# Patient Record
Sex: Female | Born: 1935 | Race: White | Hispanic: No | Smoking: Never smoker
Health system: Southern US, Community
[De-identification: ages and names within clinical notes are randomized; demographics above are authoritative.]

## PROBLEM LIST (undated history)

## (undated) DIAGNOSIS — I10 Essential (primary) hypertension: Secondary | ICD-10-CM

## (undated) DIAGNOSIS — R296 Repeated falls: Secondary | ICD-10-CM

## (undated) DIAGNOSIS — F0390 Unspecified dementia without behavioral disturbance: Secondary | ICD-10-CM

## (undated) HISTORY — PX: KIDNEY SURGERY: SHX687

## (undated) HISTORY — DX: Repeated falls: R29.6

## (undated) HISTORY — PX: FOOT SURGERY: SHX648

---

## 2013-07-31 ENCOUNTER — Emergency Department (HOSPITAL_COMMUNITY): Payer: Medicare Other

## 2013-07-31 ENCOUNTER — Encounter (HOSPITAL_COMMUNITY): Payer: Self-pay | Admitting: Emergency Medicine

## 2013-07-31 ENCOUNTER — Emergency Department (HOSPITAL_COMMUNITY)
Admission: EM | Admit: 2013-07-31 | Discharge: 2013-07-31 | Disposition: A | Discharge status: Home / Self Care | Payer: Medicare Other | Attending: Emergency Medicine | Admitting: Emergency Medicine

## 2013-07-31 DIAGNOSIS — N390 Urinary tract infection, site not specified: Secondary | ICD-10-CM | POA: Insufficient documentation

## 2013-07-31 DIAGNOSIS — Y929 Unspecified place or not applicable: Secondary | ICD-10-CM | POA: Insufficient documentation

## 2013-07-31 DIAGNOSIS — Z043 Encounter for examination and observation following other accident: Secondary | ICD-10-CM | POA: Insufficient documentation

## 2013-07-31 DIAGNOSIS — R413 Other amnesia: Secondary | ICD-10-CM | POA: Insufficient documentation

## 2013-07-31 DIAGNOSIS — R296 Repeated falls: Secondary | ICD-10-CM | POA: Insufficient documentation

## 2013-07-31 DIAGNOSIS — I10 Essential (primary) hypertension: Secondary | ICD-10-CM | POA: Insufficient documentation

## 2013-07-31 DIAGNOSIS — Y939 Activity, unspecified: Secondary | ICD-10-CM | POA: Insufficient documentation

## 2013-07-31 DIAGNOSIS — W19XXXA Unspecified fall, initial encounter: Secondary | ICD-10-CM

## 2013-07-31 LAB — URINE MICROSCOPIC-ADD ON

## 2013-07-31 LAB — CBC
HCT: 38 % (ref 36.0–46.0)
MCH: 29.9 pg (ref 26.0–34.0)
MCV: 90.9 fL (ref 78.0–100.0)
Platelets: 281 10*3/uL (ref 150–400)
RDW: 13.2 % (ref 11.5–15.5)

## 2013-07-31 LAB — URINALYSIS, ROUTINE W REFLEX MICROSCOPIC
Bilirubin Urine: NEGATIVE
Glucose, UA: NEGATIVE mg/dL
Ketones, ur: NEGATIVE mg/dL
Protein, ur: NEGATIVE mg/dL
pH: 7 (ref 5.0–8.0)

## 2013-07-31 LAB — BASIC METABOLIC PANEL
BUN: 24 mg/dL — ABNORMAL HIGH (ref 6–23)
CO2: 27 mEq/L (ref 19–32)
Calcium: 9.3 mg/dL (ref 8.4–10.5)
Chloride: 102 mEq/L (ref 96–112)
Creatinine, Ser: 0.68 mg/dL (ref 0.50–1.10)

## 2013-07-31 LAB — POCT I-STAT TROPONIN I: Troponin i, poc: 0.04 ng/mL (ref 0.00–0.08)

## 2013-07-31 MED ORDER — CEFPODOXIME PROXETIL 100 MG PO TABS: 100.0000 mg | ORAL_TABLET | Freq: Two times a day (BID) | ORAL | Status: DC

## 2013-07-31 MED ORDER — DEXTROSE 5 % IV SOLN
1.0000 g | Freq: Once | INTRAVENOUS | Status: AC
  Administered 2013-07-31: 1 g via INTRAVENOUS
  Filled 2013-07-31: qty 10

## 2013-07-31 NOTE — ED Provider Notes (Signed)
CSN: [id number]     Arrival date & time 07/31/13  1000 History   First MD Initiated Contact with Patient 07/31/13 [phone]     Chief Complaint  Patient presents with  . Fall   (Consider location/radiation/quality/duration/timing/severity/associated sxs/prior Treatment) HPI Comments: Patient fell while making the bed. She turned around and then realized she was falling. Denies any CP, SOB, dizziness. No syncope or near-syncope. Hypertensive with EMS at 200/100. Patient states she hasn't seen a doctor in a long time (>25 years). Not on any medications. She lives independently in an assisted living facility. EMS states some memory problems.  Patient is a 79 y.o. female presenting with fall. The history is provided by the patient and the EMS personnel.  Fall This is a new problem. The current episode started less than 1 hour ago. Episode frequency: once. The problem has been resolved. Pertinent negatives include no chest pain, no abdominal pain, no headaches and no shortness of breath. Nothing aggravates the symptoms. Nothing relieves the symptoms. She has tried nothing for the symptoms. The treatment provided no relief.    History reviewed. No pertinent past medical history. Past Surgical History  Procedure Laterality Date  . Foot surgery    . Kidney surgery     History reviewed. No pertinent family history. History  Substance Use Topics  . Smoking status: Never Smoker   . Smokeless tobacco: Never Used  . Alcohol Use: No   OB History   Grav Para Term Preterm Abortions TAB SAB Ect Mult Living                 Review of Systems  Constitutional: Negative for fever.  Respiratory: Negative for shortness of breath.   Cardiovascular: Negative for chest pain.  Gastrointestinal: Negative for abdominal pain.  Neurological: Negative for headaches.  All other systems reviewed and are negative.    Allergies  Review of patient's allergies indicates no known allergies.  Home Medications    Current Outpatient Rx  Name  Route  Sig  Dispense  Refill  . cefpodoxime (VANTIN) 100 MG tablet   Oral   Take 1 tablet (100 mg total) by mouth 2 (two) times daily.   28 tablet   0    BP 165/59  Pulse 68  Temp(Src) 97 F (36.1 C)  Resp 15  Ht 4\' 4"  (1.321 m)  Wt 95 lb (43.092 kg)  BMI 24.69 kg/m2  SpO2 98% Physical Exam  Nursing note and vitals reviewed. Constitutional: She is oriented to person, place, and time. She appears well-developed and well-nourished. No distress.  HENT:  Head: Normocephalic and atraumatic.  Eyes: EOM are normal. Pupils are equal, round, and reactive to light.  Neck: Normal range of motion. Neck supple.  Cardiovascular: Normal rate and regular rhythm.  Exam reveals no friction rub.   No murmur heard. Pulmonary/Chest: Effort normal and breath sounds normal. No respiratory distress. She has no wheezes. She has no rales.  Abdominal: Soft. She exhibits no distension. There is no tenderness. There is no rebound.  Musculoskeletal: Normal range of motion. She exhibits edema (mild, 1+ bilaterally - chronic).  Neurological: She is alert and oriented to person, place, and time. No cranial nerve deficit. She exhibits normal muscle tone. Coordination normal.  Skin: She is not diaphoretic.    ED Course  Procedures (including critical care time) Labs Review Labs Reviewed  BASIC METABOLIC PANEL - Abnormal; Notable for the following:    Glucose, Bld 106 (*)    BUN 24 (*)  GFR calc non Af Amer 71 (*)    GFR calc Af Amer 82 (*)    All other components within normal limits  URINALYSIS, ROUTINE W REFLEX MICROSCOPIC - Abnormal; Notable for the following:    APPearance CLOUDY (*)    Hgb urine dipstick SMALL (*)    Leukocytes, UA LARGE (*)    All other components within normal limits  URINE MICROSCOPIC-ADD ON - Abnormal; Notable for the following:    Squamous Epithelial / LPF FEW (*)    Bacteria, UA FEW (*)    All other components within normal limits   URINE CULTURE  CBC  POCT I-STAT TROPONIN I  POCT I-STAT TROPONIN I   Imaging Review Ct Head Wo Contrast  07/31/2013   *RADIOLOGY REPORT*  Clinical Data: Traumatic injury with pain  CT HEAD WITHOUT CONTRAST  Technique:  Contiguous axial images were obtained from the base of the skull through the vertex without contrast.  Comparison: None.  Findings: The bony calvarium is intact.  Diffuse atrophic changes in chronic white matter ischemic change is seen.  No findings to suggest acute hemorrhage, acute infarct or space-occupying mass lesion are noted.  IMPRESSION: Chronic changes without acute abnormality.   Original Report Authenticated By: Alcide Clever, M.D.     Date: 07/31/2013  Rate: 71  Rhythm: normal sinus rhythm  QRS Axis: left  Intervals: QT 463, QTc 504  ST/T Wave abnormalities: normal  Conduction Disutrbances:LAFB  Narrative Interpretation: Concern for LVH  Old EKG Reviewed: none available   MDM   1. Fall, initial encounter   2. Hypertension   3. UTI (urinary tract infection)     79 year old female presents with fall at home. Likely mechanical, she denies any preceding symptoms of chest pain, shortness of breath. No syncope. Denies any injury or pain at this time. Is not currently on any medications. Her initial blood pressure was 200/100 with EMS. States she feels well has not had any current issues. On exam, she is neurologically intact, moving all extremities, cranial nerves intact. No signs of trauma. With the fall and elevated blood pressures, will check basic labs and head CT. Labs show UTI - Rocephin given, will give Rx for Vantin. [name], can attend to patient and help with med administration. BP improving, last check 160s systolic. Instructed PCP f/u. Stable for discharge.   Dagmar Hait, MD 07/31/13 [phone]

## 2013-07-31 NOTE — ED Notes (Signed)
Pt from independent living, c/o fall. Pt denies pain, Per EMS pt was HTN initial 200/90.

## 2013-08-01 LAB — URINE CULTURE

## 2015-05-05 ENCOUNTER — Emergency Department (HOSPITAL_COMMUNITY)
Admission: EM | Admit: 2015-05-05 | Discharge: 2015-05-05 | Disposition: A | Discharge status: Home / Self Care | Payer: Medicare Other | Attending: Emergency Medicine | Admitting: Emergency Medicine

## 2015-05-05 ENCOUNTER — Emergency Department (HOSPITAL_COMMUNITY): Payer: Medicare Other

## 2015-05-05 ENCOUNTER — Encounter (HOSPITAL_COMMUNITY): Payer: Self-pay

## 2015-05-05 DIAGNOSIS — Y998 Other external cause status: Secondary | ICD-10-CM | POA: Diagnosis not present

## 2015-05-05 DIAGNOSIS — D649 Anemia, unspecified: Secondary | ICD-10-CM | POA: Diagnosis not present

## 2015-05-05 DIAGNOSIS — Z79899 Other long term (current) drug therapy: Secondary | ICD-10-CM | POA: Insufficient documentation

## 2015-05-05 DIAGNOSIS — S8002XA Contusion of left knee, initial encounter: Secondary | ICD-10-CM | POA: Diagnosis not present

## 2015-05-05 DIAGNOSIS — W1839XA Other fall on same level, initial encounter: Secondary | ICD-10-CM | POA: Insufficient documentation

## 2015-05-05 DIAGNOSIS — I10 Essential (primary) hypertension: Secondary | ICD-10-CM | POA: Diagnosis not present

## 2015-05-05 DIAGNOSIS — F0390 Unspecified dementia without behavioral disturbance: Secondary | ICD-10-CM | POA: Insufficient documentation

## 2015-05-05 DIAGNOSIS — Y9389 Activity, other specified: Secondary | ICD-10-CM | POA: Diagnosis not present

## 2015-05-05 DIAGNOSIS — W19XXXA Unspecified fall, initial encounter: Secondary | ICD-10-CM

## 2015-05-05 DIAGNOSIS — Y9289 Other specified places as the place of occurrence of the external cause: Secondary | ICD-10-CM | POA: Insufficient documentation

## 2015-05-05 DIAGNOSIS — S8012XA Contusion of left lower leg, initial encounter: Secondary | ICD-10-CM

## 2015-05-05 DIAGNOSIS — S8992XA Unspecified injury of left lower leg, initial encounter: Secondary | ICD-10-CM | POA: Diagnosis present

## 2015-05-05 HISTORY — DX: Unspecified dementia, unspecified severity, without behavioral disturbance, psychotic disturbance, mood disturbance, and anxiety: F03.90

## 2015-05-05 HISTORY — DX: Essential (primary) hypertension: I10

## 2015-05-05 LAB — CBC WITH DIFFERENTIAL/PLATELET
BASOS ABS: 0 10*3/uL (ref 0.0–0.1)
Basophils Relative: 0 % (ref 0–1)
Eosinophils Absolute: 0.1 10*3/uL (ref 0.0–0.7)
Eosinophils Relative: 1 % (ref 0–5)
HEMATOCRIT: 33.9 % — AB (ref 36.0–46.0)
HEMOGLOBIN: 10.8 g/dL — AB (ref 12.0–15.0)
LYMPHS ABS: 1.6 10*3/uL (ref 0.7–4.0)
Lymphocytes Relative: 18 % (ref 12–46)
MCH: 27 pg (ref 26.0–34.0)
MCHC: 31.9 g/dL (ref 30.0–36.0)
MCV: 84.8 fL (ref 78.0–100.0)
MONO ABS: 0.9 10*3/uL (ref 0.1–1.0)
Monocytes Relative: 10 % (ref 3–12)
NEUTROS ABS: 6.5 10*3/uL (ref 1.7–7.7)
NEUTROS PCT: 71 % (ref 43–77)
Platelets: 262 10*3/uL (ref 150–400)
RBC: 4 MIL/uL (ref 3.87–5.11)
RDW: 15 % (ref 11.5–15.5)
WBC: 9.1 10*3/uL (ref 4.0–10.5)

## 2015-05-05 LAB — BASIC METABOLIC PANEL
ANION GAP: 9 (ref 5–15)
BUN: 15 mg/dL (ref 6–20)
CALCIUM: 8.7 mg/dL — AB (ref 8.9–10.3)
CHLORIDE: 104 mmol/L (ref 101–111)
CO2: 24 mmol/L (ref 22–32)
Creatinine, Ser: 0.58 mg/dL (ref 0.44–1.00)
GFR calc Af Amer: >60 mL/min (ref >60)
GFR calc non Af Amer: >60 mL/min (ref >60)
GLUCOSE: 103 mg/dL — AB (ref 65–99)
Potassium: 3.9 mmol/L (ref 3.5–5.1)
Sodium: 137 mmol/L (ref 135–145)

## 2015-05-05 NOTE — ED Notes (Signed)
[facility]. Pt c/o pain in the left knee with swelling noted. Pt has hx of dementia.

## 2015-05-05 NOTE — ED Provider Notes (Signed)
CSN: [id number]     Arrival date & time 05/05/15  0815 History   First MD Initiated Contact with Patient 05/05/15 [phone]     Chief Complaint  Patient presents with  . Fall  . Knee Pain     (Consider location/radiation/quality/duration/timing/severity/associated sxs/prior Treatment) HPI  [age] year old female persist with a possible fall and swelling to her leg. Patient is demented and while she is not working confused than normal she is unable to provide a history. She does state that she fell but cannot tell how. Daughter at the bedside states that she usually walks with a rolling walker but cannot walk on her own. Does have a history of falls. The nursing home states that they found her standing next to her bed and they saw her leg was swollen and found an abrasion on her back. They assume she fell but no one saw her fall. Is not on blood thinners.  Past Medical History  Diagnosis Date  . Dementia   . Hypertension    Past Surgical History  Procedure Laterality Date  . Foot surgery    . Kidney surgery     History reviewed. No pertinent family history. History  Substance Use Topics  . Smoking status: Never Smoker   . Smokeless tobacco: Never Used  . Alcohol Use: No   OB History    No data available     Review of Systems  Unable to perform ROS: Dementia      Allergies  Review of patient's allergies indicates no known allergies.  Home Medications   Prior to Admission medications   Medication Sig Start Date End Date Taking? Authorizing Provider  acetaminophen (TYLENOL) 500 MG tablet Take 500 mg by mouth 2 (two) times daily.   Yes Historical Provider, MD  ergocalciferol (VITAMIN D2) 50000 UNITS capsule Take 50,000 Units by mouth once a week.   Yes Historical Provider, MD  torsemide (DEMADEX) 20 MG tablet Take 20 mg by mouth daily.   Yes Historical Provider, MD   BP 160/52 mmHg  Pulse 76  Temp(Src) 97.9 F (36.6 C) (Oral)  Resp 18  SpO2 97% Physical Exam   Constitutional: She appears well-developed and well-nourished.  HENT:  Head: Normocephalic and atraumatic.  Right Ear: External ear normal.  Left Ear: External ear normal.  Nose: Nose normal.  Eyes: Right eye exhibits no discharge. Left eye exhibits no discharge.  Cardiovascular: Normal rate, regular rhythm and normal heart sounds.   Pulmonary/Chest: Effort normal and breath sounds normal.  Abdominal: Soft. She exhibits no distension. There is no tenderness.  Musculoskeletal: She exhibits edema (bilateral lower extremity edema).       Left hip: She exhibits no tenderness.       Left knee: She exhibits normal range of motion. No tenderness found.       Left ankle: She exhibits normal range of motion. No tenderness.       Left lower leg: She exhibits tenderness.       Left foot: There is normal range of motion and no tenderness.  Has pain with ROM of left knee/hip but does not localize which one hurts  Neurological: She is alert. She is disoriented.  Skin: Skin is warm and dry.  Nursing note and vitals reviewed.   ED Course  Procedures (including critical care time) Labs Review Labs Reviewed  BASIC METABOLIC PANEL - Abnormal; Notable for the following:    Glucose, Bld 103 (*)    Calcium 8.7 (*)    All  other components within normal limits  CBC WITH DIFFERENTIAL/PLATELET - Abnormal; Notable for the following:    Hemoglobin 10.8 (*)    HCT 33.9 (*)    All other components within normal limits    Imaging Review Dg Tibia/fibula Left  05/05/2015   CLINICAL DATA:  Fall.  LEFT leg pain.  Initial encounter.  EXAM: LEFT TIBIA AND FIBULA - 2 VIEW  COMPARISON:  Knee radiographs today.  FINDINGS: The tibia and fibula are within normal limits. Negative for fracture. Atherosclerosis. Subcutaneous edema is present, more pronounced in the distal leg. A few scattered phleboliths are present.  IMPRESSION: No acute osseous abnormality.   Electronically Signed   By: Andreas Newport M.D.   On:  05/05/2015 09:43   Dg Knee Complete 4 Views Left  05/05/2015   CLINICAL DATA:  Fall.  Knee pain.  Initial encounter.  EXAM: LEFT KNEE - COMPLETE 4+ VIEW  COMPARISON:  None.  FINDINGS: Osteopenia. Atherosclerosis. Tricompartmental osteoarthritis of the knee. No displaced fracture. Severe patellofemoral osteoarthritis. No knee effusion. Cortical density is present along the anterior aspect of the distal femoral diaphysis, likely associated with chronic degenerative remodeling.  IMPRESSION: Osteopenia. Tricompartmental osteoarthritis of the knee. No acute abnormality.   Electronically Signed   By: Andreas Newport M.D.   On: 05/05/2015 09:42   Dg Hip Unilat With Pelvis 2-3 Views Left  05/05/2015   CLINICAL DATA:  Fall.  Initial encounter.  LEFT hip pain.  EXAM: LEFT HIP (WITH PELVIS) 2-3 VIEWS  COMPARISON:  None.  FINDINGS: Osteopenia. The pelvic rings appear intact. The RIGHT hip is externally rotated. There is no displaced hip fracture. The obturator rings appear intact. Sacrum is obscured by stool. Stool and bowel gas projects over the LEFT inferior pubic ramus. This may be due to pelvic floor laxity or colon with an an inguinal hernia or femoral hernia. Atherosclerosis.  If there is high clinical suspicion for occult hip fracture or the patient refuses to weightbear, consider further evaluation with MRI. Although CT is expeditious, evidence is lacking regarding accuracy of CT over plain film radiography.  IMPRESSION: Osteopenia.  No displaced fracture.   Electronically Signed   By: Andreas Newport M.D.   On: 05/05/2015 09:46     EKG Interpretation None      MDM   Final diagnoses:  Fall, initial encounter  Contusion of left leg, initial encounter     family notes the patient has a history of frequent falls and is very unstable on her feet. Given that the fall was not witnessed lab work was obtained but is unremarkable besides anemia that is not significant different from previous.  Patient's x-rays  are unremarkable. She is able to stand up and bear weight and thus I have low suspicion for an occult fracture. Stable for discharge back to her facility.    Pricilla Loveless, MD 05/05/15 [phone]

## 2015-05-05 NOTE — Discharge Instructions (Signed)
°Contusion °A contusion is a deep bruise. Contusions are the result of an injury that caused bleeding under the skin. The contusion may turn blue, purple, or yellow. Minor injuries will give you a painless contusion, but more severe contusions may stay painful and swollen for a few weeks.  °CAUSES  °A contusion is usually caused by a blow, trauma, or direct force to an area of the body. °SYMPTOMS  °· Swelling and redness of the injured area. °· Bruising of the injured area. °· Tenderness and soreness of the injured area. °· Pain. °DIAGNOSIS  °The diagnosis can be made by taking a history and physical exam. An X-ray, CT scan, or MRI may be needed to determine if there were any associated injuries, such as fractures. °TREATMENT  °Specific treatment will depend on what area of the body was injured. In general, the best treatment for a contusion is resting, icing, elevating, and applying cold compresses to the injured area. Over-the-counter medicines may also be recommended for pain control. Ask your caregiver what the best treatment is for your contusion. °HOME CARE INSTRUCTIONS  °· Put ice on the injured area. °· Put ice in a plastic bag. °· Place a towel between your skin and the bag. °· Leave the ice on for 15-20 minutes, 3-4 times a day, or as directed by your health care provider. °· Only take over-the-counter or prescription medicines for pain, discomfort, or fever as directed by your caregiver. Your caregiver may recommend avoiding anti-inflammatory medicines (aspirin, ibuprofen, and naproxen) for 48 hours because these medicines may increase bruising. °· Rest the injured area. °· If possible, elevate the injured area to reduce swelling. °SEEK IMMEDIATE MEDICAL CARE IF:  °· You have increased bruising or swelling. °· You have pain that is getting worse. °· Your swelling or pain is not relieved with medicines. °MAKE SURE YOU:  °· Understand these instructions. °· Will watch your condition. °· Will get help  right away if you are not doing well or get worse. °Document Released: 08/23/2005 Document Revised: 11/18/2013 Document Reviewed: 09/18/2011 °Exit[employer]® Patient Information ©2015 [employer]. This information is not intended to replace advice given to you by your health care provider. Make sure you discuss any questions you have with your health care provider. °Fall Prevention and Home Safety °Falls cause injuries and can affect all age groups. It is possible to use preventive measures to significantly decrease the likelihood of falls. There are many simple measures which can make your home safer and prevent falls. °OUTDOORS °· Repair cracks and edges of walkways and driveways. °· Remove high doorway thresholds. °· Trim shrubbery on the main path into your home. °· Have good outside lighting. °· Clear walkways of tools, rocks, debris, and clutter. °· Check that handrails are not broken and are securely fastened. Both sides of steps should have handrails. °· Have leaves, snow, and ice cleared regularly. °· Use sand or salt on walkways during winter months. °· In the garage, clean up grease or oil spills. °BATHROOM °· Install night lights. °· Install grab bars by the toilet and in the tub and shower. °· Use non-skid mats or decals in the tub or shower. °· Place a plastic non-slip stool in the shower to sit on, if needed. °· Keep floors dry and clean up all water on the floor immediately. °· Remove soap buildup in the tub or shower on a regular basis. °· Secure bath mats with non-slip, double-sided rug tape. °· Remove throw rugs and tripping hazards   from the floors. °BEDROOMS °· Install night lights. °· Make sure a bedside light is easy to reach. °· Do not use oversized bedding. °· Keep a telephone by your bedside. °· Have a firm chair with side arms to use for getting dressed. °· Remove throw rugs and tripping hazards from the floor. °KITCHEN °· Keep handles on pots and pans turned toward the center of the stove. Use  back burners when possible. °· Clean up spills quickly and allow time for drying. °· Avoid walking on wet floors. °· Avoid hot utensils and knives. °· Position shelves so they are not too high or low. °· Place commonly used objects within easy reach. °· If necessary, use a sturdy step stool with a grab bar when reaching. °· Keep electrical cables out of the way. °· Do not use floor polish or wax that makes floors slippery. If you must use wax, use non-skid floor wax. °· Remove throw rugs and tripping hazards from the floor. °STAIRWAYS °· Never leave objects on stairs. °· Place handrails on both sides of stairways and use them. Fix any loose handrails. Make sure handrails on both sides of the stairways are as long as the stairs. °· Check carpeting to make sure it is firmly attached along stairs. Make repairs to worn or loose carpet promptly. °· Avoid placing throw rugs at the top or bottom of stairways, or properly secure the rug with carpet tape to prevent slippage. Get rid of throw rugs, if possible. °· Have an electrician put in a light switch at the top and bottom of the stairs. °OTHER FALL PREVENTION TIPS °· Wear low-heel or rubber-soled shoes that are supportive and fit well. Wear closed toe shoes. °· When using a stepladder, make sure it is fully opened and both spreaders are firmly locked. Do not climb a closed stepladder. °· Add color or contrast paint or tape to grab bars and handrails in your home. Place contrasting color strips on first and last steps. °· Learn and use mobility aids as needed. Install an electrical emergency response system. °· Turn on lights to avoid dark areas. Replace light bulbs that burn out immediately. Get light switches that glow. °· Arrange furniture to create clear pathways. Keep furniture in the same place. °· Firmly attach carpet with non-skid or double-sided tape. °· Eliminate uneven floor surfaces. °· Select a carpet pattern that does not visually hide the edge of  steps. °· Be aware of all pets. °OTHER HOME SAFETY TIPS °· Set the water temperature for 120° F (48.8° C). °· Keep emergency numbers on or near the telephone. °· Keep smoke detectors on every level of the home and near sleeping areas. °Document Released: 11/03/2002 Document Revised: 05/14/2012 Document Reviewed: 02/02/2012 °ExitCare® Patient Information ©2015 [employer]. This information is not intended to replace advice given to you by your health care provider. Make sure you discuss any questions you have with your health care provider. ° °

## 2016-05-31 LAB — CBC AND DIFFERENTIAL
HEMATOCRIT: 31 % — AB (ref 36–46)
HEMOGLOBIN: 10.2 g/dL — AB (ref 12.0–16.0)
PLATELETS: 272 10*3/uL (ref 150–399)
WBC: 6.6 10*3/mL

## 2016-05-31 LAB — HEPATIC FUNCTION PANEL
ALT: 8 U/L (ref 7–35)
AST: 12 U/L — AB (ref 13–35)
Alkaline Phosphatase: 61 U/L (ref 25–125)
BILIRUBIN, TOTAL: 0.5 mg/dL

## 2016-05-31 LAB — BASIC METABOLIC PANEL
BUN: 18 mg/dL (ref 4–21)
Creatinine: 0.7 mg/dL (ref 0.5–1.1)
Glucose: 98 mg/dL
POTASSIUM: 4.5 mmol/L (ref 3.4–5.3)
Sodium: 140 mmol/L (ref 137–147)

## 2016-06-02 ENCOUNTER — Encounter: Payer: Self-pay | Admitting: Adult Health

## 2016-06-02 ENCOUNTER — Non-Acute Institutional Stay (SKILLED_NURSING_FACILITY): Payer: Medicare Other | Admitting: Adult Health

## 2016-06-02 DIAGNOSIS — F0150 Vascular dementia without behavioral disturbance: Secondary | ICD-10-CM | POA: Diagnosis not present

## 2016-06-02 DIAGNOSIS — E46 Unspecified protein-calorie malnutrition: Secondary | ICD-10-CM | POA: Diagnosis not present

## 2016-06-02 DIAGNOSIS — R600 Localized edema: Secondary | ICD-10-CM | POA: Diagnosis not present

## 2016-06-02 DIAGNOSIS — I10 Essential (primary) hypertension: Secondary | ICD-10-CM

## 2016-06-02 DIAGNOSIS — R296 Repeated falls: Secondary | ICD-10-CM | POA: Diagnosis not present

## 2016-06-02 NOTE — Progress Notes (Signed)
Patient ID: Sara Caldwell, female   DOB: 09/03/1913, [age] y.o.   MRN: [id number]   [facility] Room Number: 151-A Place of Service:  SNF (31)   CODE STATUS: DNR  No Known Allergies   Chief Complaint  Patient presents with  . Acute Visit    follow up transfer      HPI:  She has been transferred to this facility from her home. She is confused to her circumstance. She is alert to herself. She has had frequent falls. She tells me that she is feeling good. She denies any pain. There are no nursing concerns at this time. More than likely this does represent a long term placement for her.    Past Medical History  Diagnosis Date  . Dementia   . Hypertension   . Frequent falls    Past Surgical History  Procedure Laterality Date  . Foot surgery    . Kidney surgery       Social History   Social History  . Marital Status: Widowed    Spouse Name: N/A  . Number of Children: N/A  . Years of Education: N/A   Occupational History  . Not on file.   Social History Main Topics  . Smoking status: Never Smoker   . Smokeless tobacco: Never Used  . Alcohol Use: No  . Drug Use: No  . Sexual Activity: Not on file   Other Topics Concern  . Not on file   Social History Narrative   History reviewed. No pertinent family history.  Immunization History  Administered Date(s) Administered  . PPD Test 05/29/2016     VITAL SIGNS BP 122/66 mmHg  Pulse 79  Temp(Src) 97.7 F (36.5 C) (Oral)  Resp 18  Ht 5\' 3"  (1.6 m)  Wt 97 lb (43.999 kg)  BMI 17.19 kg/m2  SpO2 96%  Patient's Medications  New Prescriptions   No medications on file  Previous Medications   ACETAMINOPHEN (TYLENOL) 500 MG TABLET    Take 500 mg by mouth 2 (two) times daily.   ERGOCALCIFEROL (VITAMIN D2) 50000 UNITS CAPSULE    Take 50,000 Units by mouth once a week.   TORSEMIDE (DEMADEX) 20 MG TABLET    Take 40 mg by mouth daily.  Modified Medications   No medications on file    Discontinued Medications   TORSEMIDE (DEMADEX) 20 MG TABLET    Take 20 mg by mouth daily. Reported on 06/02/2016     SIGNIFICANT DIAGNOSTIC EXAMS   LABS REVIEWED:   05-31-16: wbc 6.6 hgb 10.2; hct 31.2 mcv 88.2; plt 272; glucose 98; bun 18; creat 0.72; k+ 4.5; na++ 140 ;liver normal albumin 3.0     Review of Systems  Constitutional: Negative for malaise/fatigue.  Respiratory: Negative for cough and shortness of breath.   Cardiovascular: Negative for chest pain and leg swelling.  Gastrointestinal: Negative for heartburn, abdominal pain and constipation.  Musculoskeletal: Negative for myalgias, back pain and joint pain.  Skin: Negative.   Psychiatric/Behavioral: The patient is not nervous/anxious.     Physical Exam  Constitutional: No distress.  Frail   Eyes: Conjunctivae are normal.  Neck: Neck supple. No JVD present. No thyromegaly present.  Cardiovascular: Normal rate, regular rhythm and intact distal pulses.   Respiratory: Effort normal and breath sounds normal. No respiratory distress. She has no wheezes.  GI: Soft. Bowel sounds are normal. She exhibits no distension. There is no tenderness.  Musculoskeletal: She exhibits edema.  Able to  move all extremities  Trace bilateral lower extremity edema   Lymphadenopathy:    She has no cervical adenopathy.  Neurological: She is alert.  Skin: Skin is warm and dry. She is not diaphoretic.  Psychiatric: She has a normal mood and affect.      ASSESSMENT/ PLAN:  1. Lower extremity edema: will continue demadex 40 mg daily will continue to monitor  2. Hypertension: is presently stable; is presently not on medications; will not make changes will monitor  3. Vascular dementia: her current weight is 97 pounds; is not on medications; will not make changes will monitor  4. Protein calorie malnutrition: her albumin is 3.0; will continue supplements per facility protocol and will monitor her status.   6. Frequent falls: she has had  one fall since her admission to this facility without injury. Will monitor   Time spent with patient   50  minutes >50% time spent counseling; reviewing medical record; tests; labs; and developing future plan of care   Synthia InnocentDeborah Zaydrian Batta NP [facility]  Contact [phone] Monday through Friday 8am- 5pm  After hours call [phone]

## 2016-06-06 ENCOUNTER — Encounter: Payer: Self-pay | Admitting: Internal Medicine

## 2016-06-06 ENCOUNTER — Non-Acute Institutional Stay (SKILLED_NURSING_FACILITY): Payer: Medicare Other | Admitting: Internal Medicine

## 2016-06-06 DIAGNOSIS — I679 Cerebrovascular disease, unspecified: Secondary | ICD-10-CM | POA: Diagnosis not present

## 2016-06-06 DIAGNOSIS — I10 Essential (primary) hypertension: Secondary | ICD-10-CM | POA: Diagnosis not present

## 2016-06-06 DIAGNOSIS — M8580 Other specified disorders of bone density and structure, unspecified site: Secondary | ICD-10-CM

## 2016-06-06 DIAGNOSIS — R600 Localized edema: Secondary | ICD-10-CM

## 2016-06-06 DIAGNOSIS — F0150 Vascular dementia without behavioral disturbance: Secondary | ICD-10-CM

## 2016-06-06 DIAGNOSIS — E43 Unspecified severe protein-calorie malnutrition: Secondary | ICD-10-CM

## 2016-06-06 DIAGNOSIS — M1712 Unilateral primary osteoarthritis, left knee: Secondary | ICD-10-CM

## 2016-06-06 DIAGNOSIS — R296 Repeated falls: Secondary | ICD-10-CM

## 2016-06-06 NOTE — Progress Notes (Signed)
Patient ID: Sara Caldwell, female   DOB: 1913-04-20, 82 y.o.   MRN: [id number]    HISTORY AND PHYSICAL   DATE: 06/06/16  Location:  Nursing Home Location: [facility] Room Number: 151 A Place of Service: SNF (31)   Extended Emergency Contact Information Primary Emergency Contact: Bond,Nancy Address: [address]          [place], [place] [address] [place] Phone: [phone] Mobile Phone: [phone] Relation: Daughter  Advanced Directive information Does patient have an advance directive?: No, Would patient like information on creating an advanced directive?: No - patient declined information FULL CODE Chief Complaint  Patient presents with  . New Admit To SNF    HPI:  82 yo female seen today as a new admission into SNF from home. She has hx frequent falls, vascular dementia, b/l LE edema, HTN, protein calorie malnutrition, osteopenia by xray, left knee tricompartmental OA. She has no concerns today. Her room had to be moved closer to nurse's station as she was attempting to get up without assistance on a frequent basis. No witnessed falls since admission. She is a poor historian due to dementia. Hx obtained from chart. CT head in 2014 showed diffuse atrophic changes and chronic ischemic white matter changes.  HTN/LE edema - stable on demadex  Osteopenia - takes Vitamin D 50K units weekly  OA left knee - takes Tylenol ES BID  Vascular dementia - does not take any meds at this time.   Frequent falls - likely related to dementia, physical deconditioning  Protein calorie malnutrition - related to dementia. Will likely need nutritional supplements  Past Medical History  Diagnosis Date  . Dementia   . Hypertension   . Frequent falls     Past Surgical History  Procedure Laterality Date  . Foot surgery    . Kidney surgery      Patient Care Team: No Pcp Per Patient as PCP - General (General  Practice)  Social History   Social History  . Marital Status: Widowed    Spouse Name: N/A  . Number of Children: N/A  . Years of Education: N/A   Occupational History  . Not on file.   Social History Main Topics  . Smoking status: Never Smoker   . Smokeless tobacco: Never Used  . Alcohol Use: No  . Drug Use: No  . Sexual Activity: Not on file   Other Topics Concern  . Not on file   Social History Narrative     reports that she has never smoked. She has never used smokeless tobacco. She reports that she does not drink alcohol or use illicit drugs.  History reviewed. No pertinent family history. No family status information on file.    Immunization History  Administered Date(s) Administered  . PPD Test 05/29/2016    No Known Allergies  Medications: Patient's Medications  New Prescriptions   No medications on file  Previous Medications   ACETAMINOPHEN (TYLENOL) 500 MG TABLET    Take 500 mg by mouth 2 (two) times daily.   ERGOCALCIFEROL (VITAMIN D2) 50000 UNITS CAPSULE    Take 50,000 Units by mouth once a week. On Fridays   TORSEMIDE (DEMADEX) 20 MG TABLET    Take 40 mg by mouth daily.  Modified Medications   No medications on file  Discontinued Medications   No medications on file    Review of Systems  Unable to perform ROS: Dementia    Filed  Vitals:   06/06/16 1109  BP: 122/66  Pulse: 79  Temp: 97.7 F (36.5 C)  TempSrc: Oral  Resp: 18  Height: '5\' 3"'  (1.6 m)  Weight: 97 lb (43.999 kg)  SpO2: 96%   Body mass index is 17.19 kg/(m^2).  Physical Exam  Constitutional: She appears well-developed.  Frail appearing , sitting in w/c in NAD  HENT:  Mouth/Throat: Oropharynx is clear and moist. No oropharyngeal exudate.  MMM  Eyes: Pupils are equal, round, and reactive to light. No scleral icterus.  Neck: Neck supple. Carotid bruit is not present. No tracheal deviation present. No thyromegaly present.  Cardiovascular: Normal rate, regular rhythm and  intact distal pulses.  Exam reveals no gallop and no friction rub.   Murmur (1/6 SEM) heard. No LE edema b/l. no calf TTP.   Pulmonary/Chest: Effort normal and breath sounds normal. No stridor. No respiratory distress. She has no wheezes. She has no rales.  Abdominal: Soft. Bowel sounds are normal. She exhibits no distension and no mass. There is no hepatomegaly. There is no tenderness. There is no rebound and no guarding.  Musculoskeletal: She exhibits edema.  Lymphadenopathy:    She has no cervical adenopathy.  Neurological: She is alert.  Skin: Skin is warm and dry. No rash noted.  Psychiatric: She has a normal mood and affect. She is agitated.     Labs reviewed: No visits with results within 3 Month(s) from this visit. Latest known visit with results is:  Admission on 05/05/2015, Discharged on 05/05/2015  Component Date Value Ref Range Status  . Sodium 05/05/2015 137  135 - 145 mmol/L Final  . Potassium 05/05/2015 3.9  3.5 - 5.1 mmol/L Final  . Chloride 05/05/2015 104  101 - 111 mmol/L Final  . CO2 05/05/2015 24  22 - 32 mmol/L Final  . Glucose, Bld 05/05/2015 103* 65 - 99 mg/dL Final  . BUN 05/05/2015 15  6 - 20 mg/dL Final  . Creatinine, Ser 05/05/2015 0.58  0.44 - 1.00 mg/dL Final  . Calcium 05/05/2015 8.7* 8.9 - 10.3 mg/dL Final  . GFR calc non Af Amer 05/05/2015 >60  >60 mL/min Final  . GFR calc Af Amer 05/05/2015 >60  >60 mL/min Final   Comment: (NOTE) The eGFR has been calculated using the CKD EPI equation. This calculation has not been validated in all clinical situations. eGFR's persistently <60 mL/min signify possible Chronic Kidney Disease.   . Anion gap 05/05/2015 9  5 - 15 Final  . WBC 05/05/2015 9.1  4.0 - 10.5 K/uL Final  . RBC 05/05/2015 4.00  3.87 - 5.11 MIL/uL Final  . Hemoglobin 05/05/2015 10.8* 12.0 - 15.0 g/dL Final  . HCT 05/05/2015 33.9* 36.0 - 46.0 % Final  . MCV 05/05/2015 84.8  78.0 - 100.0 fL Final  . MCH 05/05/2015 27.0  26.0 - 34.0 pg Final   . MCHC 05/05/2015 31.9  30.0 - 36.0 g/dL Final  . RDW 05/05/2015 15.0  11.5 - 15.5 % Final  . Platelets 05/05/2015 262  150 - 400 K/uL Final  . Neutrophils Relative % 05/05/2015 71  43 - 77 % Final  . Neutro Abs 05/05/2015 6.5  1.7 - 7.7 K/uL Final  . Lymphocytes Relative 05/05/2015 18  12 - 46 % Final  . Lymphs Abs 05/05/2015 1.6  0.7 - 4.0 K/uL Final  . Monocytes Relative 05/05/2015 10  3 - 12 % Final  . Monocytes Absolute 05/05/2015 0.9  0.1 - 1.0 K/uL Final  . Eosinophils  Relative 05/05/2015 1  0 - 5 % Final  . Eosinophils Absolute 05/05/2015 0.1  0.0 - 0.7 K/uL Final  . Basophils Relative 05/05/2015 0  0 - 1 % Final  . Basophils Absolute 05/05/2015 0.0  0.0 - 0.1 K/uL Final    No results found.   Assessment/Plan   ICD-9-CM ICD-10-CM   1. Severe protein-calorie malnutrition ([place]) 262 E43   2. Essential hypertension, benign 401.1 I10   3. Bilateral lower extremity edema 782.3 R60.0   4. Frequent falls V15.88 R29.6   5. Vascular dementia without behavioral disturbance 290.40 F01.50   6. Osteopenia determined by x-ray 733.90 M85.80   7. Primary osteoarthritis of left knee 715.16 M17.12   8. Cerebrovascular disease 437.9 I67.9    Cont current meds as ordered  Nutritional supplements as indicated  Fall precautions  PT/OT/ST as ordered  Check CBC w diff, CMP and TSH  GOAL: short term rehab --> long term care. Communicated with pt and nursing.  Will follow  Alleigh Mollica S. Perlie Gold  [facility] [address] [place], [place] [address] [phone] Cell (Monday-Friday 8 AM - 5 PM) [phone] After 5 PM and follow prompts

## 2016-06-07 DIAGNOSIS — M8580 Other specified disorders of bone density and structure, unspecified site: Secondary | ICD-10-CM | POA: Insufficient documentation

## 2016-06-07 DIAGNOSIS — I679 Cerebrovascular disease, unspecified: Secondary | ICD-10-CM | POA: Insufficient documentation

## 2016-06-07 DIAGNOSIS — M1712 Unilateral primary osteoarthritis, left knee: Secondary | ICD-10-CM | POA: Insufficient documentation

## 2016-07-02 LAB — CBC AND DIFFERENTIAL
HCT: 37 % (ref 36–46)
HEMOGLOBIN: 12 g/dL (ref 12.0–16.0)
Platelets: 354 10*3/uL (ref 150–399)
WBC: 8.9 10^3/mL

## 2016-07-02 LAB — BASIC METABOLIC PANEL
BUN: 33 mg/dL — AB (ref 4–21)
Creatinine: 1.1 mg/dL (ref 0.5–1.1)
Glucose: 148 mg/dL
Potassium: 4.3 mmol/L (ref 3.4–5.3)
Sodium: 139 mmol/L (ref 137–147)

## 2016-07-03 ENCOUNTER — Encounter: Payer: Self-pay | Admitting: Adult Health

## 2016-07-03 ENCOUNTER — Non-Acute Institutional Stay (SKILLED_NURSING_FACILITY): Payer: Medicare Other | Admitting: Adult Health

## 2016-07-03 DIAGNOSIS — I10 Essential (primary) hypertension: Secondary | ICD-10-CM | POA: Diagnosis not present

## 2016-07-03 DIAGNOSIS — F0150 Vascular dementia without behavioral disturbance: Secondary | ICD-10-CM | POA: Diagnosis not present

## 2016-07-03 DIAGNOSIS — R634 Abnormal weight loss: Secondary | ICD-10-CM | POA: Diagnosis not present

## 2016-07-03 DIAGNOSIS — E46 Unspecified protein-calorie malnutrition: Secondary | ICD-10-CM

## 2016-07-03 DIAGNOSIS — R600 Localized edema: Secondary | ICD-10-CM | POA: Diagnosis not present

## 2016-07-03 NOTE — Progress Notes (Signed)
Patient ID: Sara Caldwell, female   DOB: 03-06-1913, 82 y.o.   MRN: [id number]   Location:   [facility] Room Number: 151-A Place of Service:  SNF (31)   CODE STATUS: DNR  No Known Allergies  Chief Complaint  Patient presents with  . Medical Management of Chronic Issues    Follow up    HPI:  She is a long term resident of this facility begin seen for the management of her chronic illnesses. Overall there is little change in her status. She tells me that she is feeling good; that she has no concerns. There are no nursing concerns at this time.   Past Medical History:  Diagnosis Date  . Dementia   . Frequent falls   . Hypertension     Past Surgical History:  Procedure Laterality Date  . FOOT SURGERY    . KIDNEY SURGERY      Social History   Social History  . Marital status: Widowed    Spouse name: N/A  . Number of children: N/A  . Years of education: N/A   Occupational History  . Not on file.   Social History Main Topics  . Smoking status: Never Smoker  . Smokeless tobacco: Never Used  . Alcohol use No  . Drug use: No  . Sexual activity: Not on file   Other Topics Concern  . Not on file   Social History Narrative  . No narrative on file   History reviewed. No pertinent family history.    VITAL SIGNS BP (!) 144/73   Pulse 98   Temp 97.5 F (36.4 C) (Oral)   Resp 19   Ht  (1.6 m)   Wt 92 lb 4 oz (41.8 kg)   SpO2 93%   BMI 16.34 kg/m   Patient's Medications  New Prescriptions   No medications on file  Previous Medications   ACETAMINOPHEN (TYLENOL) 500 MG TABLET    Take 500 mg by mouth 2 (two) times daily.   ERGOCALCIFEROL (VITAMIN D2) 50000 UNITS CAPSULE    Take 50,000 Units by mouth once a week. On Fridays   TORSEMIDE (DEMADEX) 20 MG TABLET    Take 40 mg by mouth daily.  Modified Medications   No medications on file  Discontinued Medications   No medications on file     SIGNIFICANT DIAGNOSTIC EXAMS  LABS  REVIEWED:   05-31-16: wbc 6.6 hgb 10.2; hct 31.2 mcv 88.2; plt 272; glucose 98; bun 18; creat 0.72; k+ 4.5; na++ 140 ;liver normal albumin 3.0     Review of Systems  Constitutional: Negative for malaise/fatigue.  Respiratory: Negative for cough and shortness of breath.   Cardiovascular: Negative for chest pain and leg swelling.  Gastrointestinal: Negative for heartburn, abdominal pain and constipation.  Musculoskeletal: Negative for myalgias, back pain and joint pain.  Skin: Negative.   Psychiatric/Behavioral: The patient is not nervous/anxious.     Physical Exam  Constitutional: No distress.  Frail   Eyes: Conjunctivae are normal.  Neck: Neck supple. No JVD present. No thyromegaly present.  Cardiovascular: Normal rate, regular rhythm and intact distal pulses.   Respiratory: Effort normal and breath sounds normal. No respiratory distress. She has no wheezes.  GI: Soft. Bowel sounds are normal. She exhibits no distension. There is no tenderness.  Musculoskeletal: She exhibits edema.  Able to move all extremities  Trace bilateral lower extremity edema   Lymphadenopathy:    She has no cervical adenopathy.  Neurological: She is  alert.  Skin: Skin is warm and dry. She is not diaphoretic.  Psychiatric: She has a normal mood and affect.      ASSESSMENT/ PLAN:  1. Lower extremity edema: will lower her demadex to 20 mg daily and will monitor her status.   2. Hypertension: is presently stable; is presently not on medications; will not make changes will monitor  3. Vascular dementia: her current weight is 92 pounds; is not on medications; will not make changes will monitor  4. Protein calorie malnutrition: her albumin is 3.0; will continue supplements per facility protocol and will monitor her status.   5. Weight loss: will check tsh; vit B12 and folic acid.  Is more than likely related to her advanced age and her vascular dementia.     Synthia Innocenteborah Destiny Trickey NP [facility]    Contact [phone] Monday through Friday 8am- 5pm  After hours call [phone]

## 2016-07-05 LAB — TSH: TSH: 1.74 u[IU]/mL (ref 0.41–5.90)

## 2016-07-10 ENCOUNTER — Encounter: Payer: Self-pay | Admitting: Adult Health

## 2016-07-10 NOTE — Progress Notes (Signed)
Patient ID: Sara Caldwell, female   DOB: 04/18/1913, [age] y.o.   MRN: [id number]   [facility] Room Number: 151-A Place of Service:  SNF (31)   CODE STATUS: DNR  No Known Allergies  Chief Complaint  Patient presents with  . Medical Management of Chronic Issues    Follow up    HPI:  She is a long term resident of this facility being seen for the management of her chronic illnesses.   Past Medical History:  Diagnosis Date  . Dementia   . Frequent falls   . Hypertension     Past Surgical History:  Procedure Laterality Date  . FOOT SURGERY    . KIDNEY SURGERY      Social History   Social History  . Marital status: Widowed    Spouse name: N/A  . Number of children: N/A  . Years of education: N/A   Occupational History  . Not on file.   Social History Main Topics  . Smoking status: Never Smoker  . Smokeless tobacco: Never Used  . Alcohol use No  . Drug use: No  . Sexual activity: Not on file   Other Topics Concern  . Not on file   Social History Narrative  . No narrative on file   No family history on file.    VITAL SIGNS BP (!) 125/52   Pulse 70   Temp 97.5 F (36.4 C) (Oral)   Resp 18   Ht 5\' 3"  (1.6 m)   Wt 91 lb 6 oz (41.4 kg)   SpO2 94%   BMI 16.19 kg/m   Patient's Medications  New Prescriptions   No medications on file  Previous Medications   ACETAMINOPHEN (TYLENOL) 500 MG TABLET    Take 500 mg by mouth 2 (two) times daily.   ERGOCALCIFEROL (VITAMIN D2) 50000 UNITS CAPSULE    Take 50,000 Units by mouth once a week. On Fridays   TORSEMIDE (DEMADEX) 20 MG TABLET    Take 20 mg by mouth daily.   Modified Medications   No medications on file  Discontinued Medications   No medications on file     SIGNIFICANT DIAGNOSTIC EXAMS  LABS REVIEWED:   05-31-16: wbc 6.6 hgb 10.2; hct 31.2 mcv 88.2; plt 272; glucose 98; bun 18; creat 0.72; k+ 4.5; na++ 140 ;liver normal albumin 3.0     Review of Systems    Constitutional: Negative for malaise/fatigue.  Respiratory: Negative for cough and shortness of breath.   Cardiovascular: Negative for chest pain and leg swelling.  Gastrointestinal: Negative for heartburn, abdominal pain and constipation.  Musculoskeletal: Negative for myalgias, back pain and joint pain.  Skin: Negative.   Psychiatric/Behavioral: The patient is not nervous/anxious.     Physical Exam  Constitutional: No distress.  Frail   Eyes: Conjunctivae are normal.  Neck: Neck supple. No JVD present. No thyromegaly present.  Cardiovascular: Normal rate, regular rhythm and intact distal pulses.   Respiratory: Effort normal and breath sounds normal. No respiratory distress. She has no wheezes.  GI: Soft. Bowel sounds are normal. She exhibits no distension. There is no tenderness.  Musculoskeletal: She exhibits edema.  Able to move all extremities  Trace bilateral lower extremity edema   Lymphadenopathy:    She has no cervical adenopathy.  Neurological: She is alert.  Skin: Skin is warm and dry. She is not diaphoretic.  Psychiatric: She has a normal mood and affect.      ASSESSMENT/ PLAN:  1. Lower extremity edema: will lower her demadex to 20 mg daily and will monitor her status.   2. Hypertension: is presently stable; is presently not on medications; will not make changes will monitor  3. Vascular dementia: her current weight is 92 pounds; is not on medications; will not make changes will monitor  4. Protein calorie malnutrition: her albumin is 3.0; will continue supplements per facility protocol and will monitor her status.      MD is aware of resident's narcotic use and is in agreement with current plan of care. We will attempt to wean resident as apropriate   Synthia Innocenteborah Green NP [facility]  Contact [phone] Monday through Friday 8am- 5pm  After hours call [phone]    This encounter was created in error - please disregard.

## 2016-08-15 ENCOUNTER — Non-Acute Institutional Stay (SKILLED_NURSING_FACILITY): Payer: Medicare Other | Admitting: Internal Medicine

## 2016-08-15 ENCOUNTER — Encounter: Payer: Self-pay | Admitting: Internal Medicine

## 2016-08-15 DIAGNOSIS — R0989 Other specified symptoms and signs involving the circulatory and respiratory systems: Secondary | ICD-10-CM | POA: Diagnosis not present

## 2016-08-15 DIAGNOSIS — I679 Cerebrovascular disease, unspecified: Secondary | ICD-10-CM | POA: Diagnosis not present

## 2016-08-15 DIAGNOSIS — R600 Localized edema: Secondary | ICD-10-CM

## 2016-08-15 DIAGNOSIS — F0150 Vascular dementia without behavioral disturbance: Secondary | ICD-10-CM | POA: Diagnosis not present

## 2016-08-15 DIAGNOSIS — E46 Unspecified protein-calorie malnutrition: Secondary | ICD-10-CM | POA: Diagnosis not present

## 2016-08-15 DIAGNOSIS — I10 Essential (primary) hypertension: Secondary | ICD-10-CM

## 2016-08-15 DIAGNOSIS — R634 Abnormal weight loss: Secondary | ICD-10-CM

## 2016-08-15 NOTE — Progress Notes (Signed)
Patient ID: Sara Caldwell, female   DOB: 1913-11-07, 82 y.o.   MRN: [id number]    DATE:  08/15/2016  Location:    [facility] Room Number: 151 A Place of Service: SNF (31)   Extended Emergency Contact Information Primary Emergency Contact: Bond,Nancy Address: [address]          [place], [place] [address] [place] Home Phone: [phone] Mobile Phone: [phone] Relation: Daughter Secondary Emergency Contact: [facility] Address: [address] [place], [place] [address] [place] Home Phone: [phone] Mobile Phone: [phone] Relation: Grandaughter  Advanced Directive information Does patient have an advance directive?: Yes, Type of Advance Directive: Out of facility DNR (pink MOST or yellow form), Does patient want to make changes to advanced directive?: No - Patient declined  Chief Complaint  Patient presents with  . Medical Management of Chronic Issues    Routine Visit    HPI:  82 yo female long term resident seen today for f/u. She has no c/o. Reduced po intake. Sleeping well. No nursing concerns. No falls. She is a poor historian due to dementia. Hx obtained from chart.  Lower extremity edema - stable on demadex 20 mg daily  Hypertension- BP stable. She takes demadex. Cr 1.1  Vascular dementia - likely end stage and weight loss is to be expected. Albumin is 3.0. She does not take any meds for cognition.  Protein calorie malnutrition - last albumin  3.0. She gets nutritional  supplements per facility protocol  Weight loss -  likely related to her advanced age and her vascular dementia. She is down several ounces compared to last month. She is on nutritional supplements. Hgb 12.0  Past Medical History:  Diagnosis Date  . Dementia   . Frequent falls   . Hypertension     Past Surgical History:  Procedure Laterality Date  . FOOT SURGERY    . KIDNEY SURGERY      Patient Care Team: No Pcp Per  Patient as PCP - General (General Practice)  Social History   Social History  . Marital status: Widowed    Spouse name: N/A  . Number of children: N/A  . Years of education: N/A   Occupational History  . Not on file.   Social History Main Topics  . Smoking status: Never Smoker  . Smokeless tobacco: Never Used  . Alcohol use No  . Drug use: No  . Sexual activity: Not on file   Other Topics Concern  . Not on file   Social History Narrative  . No narrative on file     reports that she has never smoked. She has never used smokeless tobacco. She reports that she does not drink alcohol or use drugs.  History reviewed. No pertinent family history. No family status information on file.    Immunization History  Administered Date(s) Administered  . PPD Test 05/29/2016    No Known Allergies  Medications: Patient's Medications  New Prescriptions   No medications on file  Previous Medications   ACETAMINOPHEN (TYLENOL) 500 MG TABLET    Take 500 mg by mouth 2 (two) times daily.   ERGOCALCIFEROL (VITAMIN D2) 50000 UNITS CAPSULE    Take 50,000 Units by mouth once a week. On Fridays   TORSEMIDE (DEMADEX) 20 MG TABLET    Take 20 mg by mouth daily.   Modified Medications   No medications on file  Discontinued Medications  No medications on file    Review of Systems  Unable to perform ROS: Dementia    Vitals:   08/15/16 1242  BP: 118/68  Pulse: 78  Resp: 18  Temp: 98.2 F (36.8 C)  TempSrc: Oral  SpO2: 94%  Weight: 91 lb (41.3 kg)  Height: 5\' 3"  (1.6 m)   Body mass index is 16.12 kg/m.  Physical Exam  Constitutional: She appears well-developed.  Frail appearing , sitting in w/c in NAD  HENT:  Mouth/Throat: Oropharynx is clear and moist. No oropharyngeal exudate.  MMM  Eyes: Pupils are equal, round, and reactive to light. No scleral icterus.  Neck: Neck supple. Carotid bruit is present (left). No tracheal deviation present. No thyromegaly present.    Cardiovascular: Normal rate, regular rhythm and intact distal pulses.  Exam reveals no gallop and no friction rub.   Murmur (1/6 SEM) heard. No LE edema b/l. no calf TTP.   Pulmonary/Chest: Effort normal and breath sounds normal. No stridor. No respiratory distress. She has no wheezes. She has no rales.  Abdominal: Soft. Bowel sounds are normal. She exhibits no distension and no mass. There is no hepatomegaly. There is no tenderness. There is no rebound and no guarding.  Musculoskeletal: She exhibits edema.  Lymphadenopathy:    She has no cervical adenopathy.  Neurological: She is alert.  Skin: Skin is warm and dry. No rash noted.  Psychiatric: She has a normal mood and affect. Her behavior is normal.     Labs reviewed: Nursing Home on 08/15/2016  Component Date Value Ref Range Status  . Hemoglobin 07/02/2016 12.0  12.0 - 16.0 g/dL Final  . HCT 54/07/8118 37  36 - 46 % Final  . Platelets 07/02/2016 354  150 - 399 K/L Final  . WBC 07/02/2016 8.9  10^3/mL Final  . Glucose 07/02/2016 148  mg/dL Final  . BUN 14/78/2956 33* 4 - 21 mg/dL Final  . Creatinine 21/30/8657 1.1  0.5 - 1.1 mg/dL Final  . Potassium 84/69/6295 4.3  3.4 - 5.3 mmol/L Final  . Sodium 07/02/2016 139  137 - 147 mmol/L Final  Nursing Home on 07/03/2016  Component Date Value Ref Range Status  . Hemoglobin 05/31/2016 10.2* 12.0 - 16.0 g/dL Final  . HCT 28/41/3244 31* 36 - 46 % Final  . Platelets 05/31/2016 272  150 - 399 K/L Final  . WBC 05/31/2016 6.6  10^3/mL Final  . Glucose 05/31/2016 98  mg/dL Final  . BUN 11/29/7251 18  4 - 21 mg/dL Final  . Creatinine 66/44/0347 0.7  0.5 - 1.1 mg/dL Final  . Potassium 42/59/5638 4.5  3.4 - 5.3 mmol/L Final  . Sodium 05/31/2016 140  137 - 147 mmol/L Final  . Alkaline Phosphatase 05/31/2016 61  25 - 125 U/L Final  . ALT 05/31/2016 8  7 - 35 U/L Final  . AST 05/31/2016 12* 13 - 35 U/L Final  . Bilirubin, Total 05/31/2016 0.5  mg/dL Final    No results  found.   Assessment/Plan   ICD-9-CM ICD-10-CM   1. Loss of weight - steady decline 783.21 R63.4   2. Protein-calorie malnutrition ([facility]) 263.9 E46   3. Essential hypertension, benign 401.1 I10   4. Vascular dementia without behavioral disturbance 290.40 F01.50   5. Bilateral lower extremity edema 782.3 R60.0   6. Cerebrovascular disease 437.9 I67.9   7.      Left carotid bruit - likely related to #6. She is not an anticoagulant candidate due to hx frequent  falls and advanced age.  Cont current meds as ordered  T/c palliative care due to her gradual decline. She is DNR  PT/OT/ST as indicated  Cont nutritional supplements as ordered  Will follow  Fatuma Dowers S. Ancil Linseyarter, D. O., F. A. C. O. I.  [facility] [address] [place], [place] [address] [phone] Cell (Monday-Friday 8 AM - 5 PM) [phone] After 5 PM and follow prompts

## 2016-09-14 ENCOUNTER — Non-Acute Institutional Stay (SKILLED_NURSING_FACILITY): Payer: Medicare Other | Admitting: Adult Health

## 2016-09-14 DIAGNOSIS — E440 Moderate protein-calorie malnutrition: Secondary | ICD-10-CM | POA: Diagnosis not present

## 2016-09-14 DIAGNOSIS — R634 Abnormal weight loss: Secondary | ICD-10-CM

## 2016-09-14 DIAGNOSIS — F0150 Vascular dementia without behavioral disturbance: Secondary | ICD-10-CM | POA: Diagnosis not present

## 2016-09-14 DIAGNOSIS — I10 Essential (primary) hypertension: Secondary | ICD-10-CM

## 2016-09-14 DIAGNOSIS — R600 Localized edema: Secondary | ICD-10-CM

## 2016-09-14 NOTE — Progress Notes (Signed)
Patient ID: Sara DadJeanette Reppond, female   DOB: 03/14/1913, [age] y.o.   MRN: [id number]   Location:   [facility] Room Number: 151-A Place of Service:  SNF (31)   CODE STATUS: DNR  No Known Allergies  Chief Complaint  Patient presents with  . Medical Management of Chronic Issues    Follow up    HPI:    Past Medical History:  Diagnosis Date  . Dementia   . Frequent falls   . Hypertension     Past Surgical History:  Procedure Laterality Date  . FOOT SURGERY    . KIDNEY SURGERY      Social History   Social History  . Marital status: Widowed    Spouse name: N/A  . Number of children: N/A  . Years of education: N/A   Occupational History  . Not on file.   Social History Main Topics  . Smoking status: Never Smoker  . Smokeless tobacco: Never Used  . Alcohol use No  . Drug use: No  . Sexual activity: Not on file   Other Topics Concern  . Not on file   Social History Narrative  . No narrative on file   History reviewed. No pertinent family history.    VITAL SIGNS BP (!) 143/60   Pulse 88   Temp 97.7 F (36.5 C) (Oral)   Resp 19   Ht 5\' 3"  (1.6 m)   Wt 84 lb (38.1 kg)   SpO2 95%   BMI 14.88 kg/m   Patient's Medications  New Prescriptions   No medications on file  Previous Medications   ACETAMINOPHEN (TYLENOL) 500 MG TABLET    Take 500 mg by mouth 2 (two) times daily.   ERGOCALCIFEROL (VITAMIN D2) 50000 UNITS CAPSULE    Take 50,000 Units by mouth once a week. On Fridays   SERTRALINE (ZOLOFT) 25 MG TABLET    Take 25 mg by mouth daily.   TORSEMIDE (DEMADEX) 20 MG TABLET    Take 20 mg by mouth daily.    TRAZODONE (DESYREL) 50 MG TABLET    Take 50 mg by mouth at bedtime.  Modified Medications   No medications on file  Discontinued Medications   No medications on file     SIGNIFICANT DIAGNOSTIC EXAMS  LABS REVIEWED:   05-31-16: wbc 6.6 hgb 10.2; hct 31.2 mcv 88.2; plt 272; glucose 98; bun 18; creat 0.72; k+ 4.5; na++ 140 ;liver  normal albumin 3.0     Review of Systems  Constitutional: Negative for malaise/fatigue.  Respiratory: Negative for cough and shortness of breath.   Cardiovascular: Negative for chest pain and leg swelling.  Gastrointestinal: Negative for heartburn, abdominal pain and constipation.  Musculoskeletal: Negative for myalgias, back pain and joint pain.  Skin: Negative.   Psychiatric/Behavioral: The patient is not nervous/anxious.     Physical Exam  Constitutional: No distress.  Frail   Eyes: Conjunctivae are normal.  Neck: Neck supple. No JVD present. No thyromegaly present.  Cardiovascular: Normal rate, regular rhythm and intact distal pulses.   Respiratory: Effort normal and breath sounds normal. No respiratory distress. She has no wheezes.  GI: Soft. Bowel sounds are normal. She exhibits no distension. There is no tenderness.  Musculoskeletal: She exhibits edema.  Able to move all extremities  Trace bilateral lower extremity edema   Lymphadenopathy:    She has no cervical adenopathy.  Neurological: She is alert.  Skin: Skin is warm and dry. She is not diaphoretic.  Psychiatric: She  has a normal mood and affect.      ASSESSMENT/ PLAN:  1. Lower extremity edema: will lower her demadex to 20 mg daily and will monitor her status.   2. Hypertension: is presently stable; is presently not on medications; will not make changes will monitor  3. Vascular dementia: her current weight is 92 pounds; is not on medications; will not make changes will monitor  4. Protein calorie malnutrition: her albumin is 3.0; will continue supplements per facility protocol and will monitor her status.   5. Weight loss: will check tsh; vit B12 and folic acid.  Is more than likely related to her advanced age and her vascular dementia.             MD is aware of resident's narcotic use and is in agreement with current plan of care. We will attempt to wean resident as appropriate.   Synthia Innocent NP [facility]  Contact [phone] Monday through Friday 8am- 5pm  After hours call [phone]   This encounter was created in error - please disregard.

## 2016-09-21 ENCOUNTER — Encounter: Payer: Self-pay | Admitting: Adult Health

## 2016-09-21 NOTE — Progress Notes (Signed)
Patient ID: Sara Caldwell, female   DOB: 1913/08/15, 82 y.o.   MRN: [id number]   Location:   [facility] Room Number: 151-A Place of Service:  SNF (31)   CODE STATUS: DNR  No Known Allergies  Chief Complaint  Patient presents with  . Medical Management of Chronic Issues    Follow up    HPI:  She is a long term resident of this facility being seen for the management of her chronic illnesses. overall her status is stable. She tells me that she is feeling good. There are no nursing concerns at this time.    Past Medical History:  Diagnosis Date  . Dementia   . Frequent falls   . Hypertension     Past Surgical History:  Procedure Laterality Date  . FOOT SURGERY    . KIDNEY SURGERY      Social History   Social History  . Marital status: Widowed    Spouse name: N/A  . Number of children: N/A  . Years of education: N/A   Occupational History  . Not on file.   Social History Main Topics  . Smoking status: Never Smoker  . Smokeless tobacco: Never Used  . Alcohol use No  . Drug use: No  . Sexual activity: Not on file   Other Topics Concern  . Not on file   Social History Narrative  . No narrative on file   No family history on file.    VITAL SIGNS BP (!) 143/60   Pulse 88   Temp 97.7 F (36.5 C) (Oral)   Resp 19   Ht 5\' 3"  (1.6 m)   Wt 84 lb (38.1 kg)   SpO2 95%   BMI 14.88 kg/m   Patient's Medications  New Prescriptions   No medications on file  Previous Medications   ACETAMINOPHEN (TYLENOL) 500 MG TABLET    Take 500 mg by mouth 2 (two) times daily.   ERGOCALCIFEROL (VITAMIN D2) 50000 UNITS CAPSULE    Take 50,000 Units by mouth once a week. On Fridays   SERTRALINE (ZOLOFT) 25 MG TABLET    Take 25 mg by mouth daily.   TORSEMIDE (DEMADEX) 20 MG TABLET    Take 20 mg by mouth daily.    TRAZODONE (DESYREL) 50 MG TABLET    Take 50 mg by mouth at bedtime.  Modified Medications   No medications on file  Discontinued Medications   No medications on file     SIGNIFICANT DIAGNOSTIC EXAMS  LABS REVIEWED:   05-31-16: wbc 6.6 hgb 10.2; hct 31.2 mcv 88.2; plt 272; glucose 98; bun 18; creat 0.72; k+ 4.5; na++ 140 ;liver normal albumin 3.0  07-02-16: wbc 8.9; hgb 12.0; hct 36.8; mcv 89.1; plt 354; glucose 148; bun 33; creat 1.10 ;k+ 4.3; na++ 139 07-03-16: urine culture: no growth  07-05-16: tsh 1.74; vit B12: 237; folate 18.8   Review of Systems  Constitutional: Negative for malaise/fatigue.  Respiratory: Negative for cough and shortness of breath.   Cardiovascular: Negative for chest pain and leg swelling.  Gastrointestinal: Negative for heartburn, abdominal pain and constipation.  Musculoskeletal: Negative for myalgias, back pain and joint pain.  Skin: Negative.   Psychiatric/Behavioral: The patient is not nervous/anxious.     Physical Exam  Constitutional: No distress.  Frail   Eyes: Conjunctivae are normal.  Neck: Neck supple. No JVD present. No thyromegaly present.  Cardiovascular: Normal rate, regular rhythm and intact distal pulses.   Respiratory: Effort normal and  breath sounds normal. No respiratory distress. She has no wheezes.  GI: Soft. Bowel sounds are normal. She exhibits no distension. There is no tenderness.  Musculoskeletal: She exhibits edema.  Able to move all extremities  Trace bilateral lower extremity edema   Lymphadenopathy:    She has no cervical adenopathy.  Neurological: She is alert.  Skin: Skin is warm and dry. She is not diaphoretic.  Psychiatric: She has a normal mood and affect.      ASSESSMENT/ PLAN:  1. Lower extremity edema: will lower her demadex to 20 mg daily and will monitor her status.   2. Hypertension: is presently stable; is presently not on medications; will not make changes will monitor  3. Vascular dementia: her current weight is 84 pounds; is not on medications; will not make changes will monitor  4. Protein calorie malnutrition: her albumin is 3.0; will  continue supplements per facility protocol and will monitor her status.   5. Weight loss:   Is  related to her advanced age and her vascular dementia; which is an unfortunate outcome of the end stage of this disease process.      Synthia Innocenteborah Emily Forse NP [facility]  Contact [phone] Monday through Friday 8am- 5pm  After hours call [phone]

## 2016-10-05 ENCOUNTER — Encounter: Payer: Self-pay | Admitting: Adult Health

## 2016-10-05 ENCOUNTER — Non-Acute Institutional Stay (SKILLED_NURSING_FACILITY): Payer: Medicare Other | Admitting: Adult Health

## 2016-10-05 DIAGNOSIS — L03116 Cellulitis of left lower limb: Secondary | ICD-10-CM

## 2016-10-05 DIAGNOSIS — S90822A Blister (nonthermal), left foot, initial encounter: Secondary | ICD-10-CM

## 2016-10-05 NOTE — Progress Notes (Signed)
Patient ID: Sara Caldwell, female   DOB: 08/07/1913, [age] y.o.   MRN: [id number]    Location:   [facility] Room Number: 151-A Place of Service:  SNF (31)   CODE STATUS: DNR  No Known Allergies  Chief Complaint  Patient presents with  . Acute Visit    HPI:  She has on her left heel a large blister with redness and swelling present does have pain present as well. There are no reports of fever present. She is unable to fully participate in the hpi or ros.   Past Medical History:  Diagnosis Date  . Dementia   . Frequent falls   . Hypertension     Past Surgical History:  Procedure Laterality Date  . FOOT SURGERY    . KIDNEY SURGERY      Social History   Social History  . Marital status: Widowed    Spouse name: N/A  . Number of children: N/A  . Years of education: N/A   Occupational History  . Not on file.   Social History Main Topics  . Smoking status: Never Smoker  . Smokeless tobacco: Never Used  . Alcohol use No  . Drug use: No  . Sexual activity: Not on file   Other Topics Concern  . Not on file   Social History Narrative  . No narrative on file   History reviewed. No pertinent family history.   Vital signs reviewed   Patient's Medications  New Prescriptions   No medications on file  Previous Medications   ACETAMINOPHEN (TYLENOL) 500 MG TABLET    Take 500 mg by mouth 2 (two) times daily.   ERGOCALCIFEROL (VITAMIN D2) 50000 UNITS CAPSULE    Take 50,000 Units by mouth once a week. On Fridays   SERTRALINE (ZOLOFT) 25 MG TABLET    Take 25 mg by mouth daily.   TORSEMIDE (DEMADEX) 20 MG TABLET    Take 20 mg by mouth daily.    TRAZODONE (DESYREL) 50 MG TABLET    Take 50 mg by mouth at bedtime.  Modified Medications   No medications on file  Discontinued Medications   No medications on file     SIGNIFICANT DIAGNOSTIC EXAMS  LABS REVIEWED:   05-31-16: wbc 6.6 hgb 10.2; hct 31.2 mcv 88.2; plt 272; glucose 98; bun 18; creat 0.72; k+  4.5; na++ 140 ;liver normal albumin 3.0  07-02-16: wbc 8.9; hgb 12.0; hct 36.8; mcv 89.1; plt 354; glucose 148; bun 33; creat 1.10 ;k+ 4.3; na++ 139 07-03-16: urine culture: no growth  07-05-16: tsh 1.74; vit B12: 237; folate 18.8   Review of Systems  Unable to perform ROS: Dementia     Physical Exam  Constitutional: No distress.  Frail   Eyes: Conjunctivae are normal.  Neck: Neck supple. No JVD present. No thyromegaly present.  Cardiovascular: Normal rate, regular rhythm and intact distal pulses.   Respiratory: Effort normal and breath sounds normal. No respiratory distress. She has no wheezes.  GI: Soft. Bowel sounds are normal. She exhibits no distension. There is no tenderness.  Musculoskeletal: She exhibits edema.  Able to move all extremities  Trace bilateral lower extremity edema   Lymphadenopathy:    She has no cervical adenopathy.  Neurological: She is alert.  Skin: Skin is warm and dry. She is not diaphoretic.  Left heel with redness; warmth tenderness; large blister present with swelling present.  Psychiatric: She has a normal mood and affect.      ASSESSMENT/  PLAN:  1. Left heel blister 2. Cellulitis:  Will use skin prep twice daily to heel; staff instructed to keep pressure off heel Will being doxycycline 100 mg twice daily with florstor twice daily  Will check cbc; cmp   MD is aware of resident's narcotic use and is in agreement with current plan of care. We will attempt to wean resident as appropriate.      Synthia Innocenteborah Green NP [facility]  Contact [phone] Monday through Friday 8am- 5pm  After hours call [phone]

## 2016-10-06 LAB — HEPATIC FUNCTION PANEL
ALT: 7 U/L (ref 7–35)
AST: 10 U/L — AB (ref 13–35)
Alkaline Phosphatase: 80 U/L (ref 25–125)
Bilirubin, Total: 0.2 mg/dL

## 2016-10-06 LAB — CBC AND DIFFERENTIAL
HCT: 31 % — AB (ref 36–46)
HEMOGLOBIN: 9.6 g/dL — AB (ref 12.0–16.0)
Platelets: 329 10*3/uL (ref 150–399)
WBC: 6.6 10*3/mL

## 2016-10-06 LAB — BASIC METABOLIC PANEL
BUN: 22 mg/dL — AB (ref 4–21)
Creatinine: 0.7 mg/dL (ref 0.5–1.1)
Glucose: 86 mg/dL
Potassium: 4.9 mmol/L (ref 3.4–5.3)
SODIUM: 141 mmol/L (ref 137–147)

## 2016-10-12 ENCOUNTER — Encounter: Payer: Self-pay | Admitting: Adult Health

## 2016-10-12 ENCOUNTER — Non-Acute Institutional Stay (SKILLED_NURSING_FACILITY): Payer: Medicare Other | Admitting: Adult Health

## 2016-10-12 DIAGNOSIS — S90822D Blister (nonthermal), left foot, subsequent encounter: Secondary | ICD-10-CM | POA: Diagnosis not present

## 2016-10-12 DIAGNOSIS — F0150 Vascular dementia without behavioral disturbance: Secondary | ICD-10-CM

## 2016-10-12 DIAGNOSIS — I10 Essential (primary) hypertension: Secondary | ICD-10-CM

## 2016-10-12 DIAGNOSIS — E440 Moderate protein-calorie malnutrition: Secondary | ICD-10-CM | POA: Diagnosis not present

## 2016-10-12 DIAGNOSIS — R600 Localized edema: Secondary | ICD-10-CM | POA: Diagnosis not present

## 2016-10-12 DIAGNOSIS — L03116 Cellulitis of left lower limb: Secondary | ICD-10-CM | POA: Insufficient documentation

## 2016-10-12 NOTE — Progress Notes (Signed)
Patient ID: Sara Caldwell, female   DOB: 04/25/1913, 82 y.o.   MRN: [id number]   Location:   [facility] Room Number: 151-A Place of Service:  SNF (31)   CODE STATUS: DNR  No Known Allergies  Chief Complaint  Patient presents with  . Medical Management of Chronic Issues    Follow up    HPI:  She is a long term resident of this facility being seen for the management of her chronic  illnesses.  She continues on doxycycline for her left heel.   Past Medical History:  Diagnosis Date  . Dementia   . Frequent falls   . Hypertension     Past Surgical History:  Procedure Laterality Date  . FOOT SURGERY    . KIDNEY SURGERY      Social History   Social History  . Marital status: Widowed    Spouse name: N/A  . Number of children: N/A  . Years of education: N/A   Occupational History  . Not on file.   Social History Main Topics  . Smoking status: Never Smoker  . Smokeless tobacco: Never Used  . Alcohol use No  . Drug use: No  . Sexual activity: Not on file   Other Topics Concern  . Not on file   Social History Narrative  . No narrative on file   History reviewed. No pertinent family history.    VITAL SIGNS BP (!) 144/64   Pulse 68   Temp 97.7 F (36.5 C) (Oral)   Resp 18   Ht 5\' 3"  (1.6 m)   Wt 81 lb 8 oz (37 kg)   SpO2 93%   BMI 14.44 kg/m   Patient's Medications  New Prescriptions   No medications on file  Previous Medications   DOXYCYCLINE (VIBRAMYCIN) 100 MG CAPSULE    Take 100 mg by mouth 2 (two) times daily.   ERGOCALCIFEROL (VITAMIN D2) 50000 UNITS CAPSULE    Take 50,000 Units by mouth once a week. On Fridays   SACCHAROMYCES BOULARDII (FLORASTOR) 250 MG CAPSULE    Take 250 mg by mouth 2 (two) times daily.   SERTRALINE (ZOLOFT) 25 MG TABLET    Take 25 mg by mouth daily.   TORSEMIDE (DEMADEX) 20 MG TABLET    Take 20 mg by mouth daily.    TRAZODONE (DESYREL) 50 MG TABLET    Take 50 mg by mouth at bedtime.  Modified  Medications   No medications on file  Discontinued Medications   ACETAMINOPHEN (TYLENOL) 500 MG TABLET    Take 500 mg by mouth 2 (two) times daily.     SIGNIFICANT DIAGNOSTIC EXAMS  LABS REVIEWED:   05-31-16: wbc 6.6 hgb 10.2; hct 31.2 mcv 88.2; plt 272; glucose 98; bun 18; creat 0.72; k+ 4.5; na++ 140 ;liver normal albumin 3.0  07-02-16: wbc 8.9; hgb 12.0; hct 36.8; mcv 89.1; plt 354; glucose 148; bun 33; creat 1.10 ;k+ 4.3; na++ 139 07-03-16: urine culture: no growth  07-05-16: tsh 1.74; vit B12: 237; folate 18.8 10-06-16:wbc 6.6; hgb 9.6; hct 30.8; mcv 94.0; plt 329  glucose 86; bun 22.1; creat 0.73; k+ 4.9; na++ 141; liver normal albumin 2.7 pre-albumin 13    Review of Systems  Unable to perform ROS: Dementia     Physical Exam  Constitutional: No distress.  Frail   Eyes: Conjunctivae are normal.  Neck: Neck supple. No JVD present. No thyromegaly present.  Cardiovascular: Normal rate, regular rhythm and intact distal pulses.  Respiratory: Effort normal and breath sounds normal. No respiratory distress. She has no wheezes.  GI: Soft. Bowel sounds are normal. She exhibits no distension. There is no tenderness.  Musculoskeletal: She exhibits edema.  Able to move all extremities  Trace bilateral lower extremity edema   Lymphadenopathy:    She has no cervical adenopathy.  Neurological: She is alert.  Skin: Skin is warm and dry. She is not diaphoretic.  Left heel  large blister present is improving  Psychiatric: She has a normal mood and affect.      ASSESSMENT/ PLAN:  1. Lower extremity edema: will lower her demadex to 10 mg daily and will monitor her status.   2. Hypertension: is presently stable; is presently not on medications; will not make changes will monitor  3. Vascular dementia: her current weight is 81 pounds; is not on medications; will not make changes will monitor  4. Protein calorie malnutrition: her albumin is 2.7; will continue supplements per facility  protocol and will monitor her status.   5. Weight loss:   Is  related to her advanced age and her vascular dementia; which is an unfortunate outcome of the end stage of this disease process.    6. Left heel blister: will continue her current treatment and will continue to monitor her status.       Synthia Innocenteborah Juandiego Kolenovic NP [facility]  Contact [phone] Monday through Friday 8am- 5pm  After hours call [phone]

## 2016-11-23 ENCOUNTER — Other Ambulatory Visit: Payer: Self-pay

## 2016-11-23 ENCOUNTER — Non-Acute Institutional Stay (SKILLED_NURSING_FACILITY): Payer: Medicare Other | Admitting: Adult Health

## 2016-11-23 ENCOUNTER — Encounter: Payer: Self-pay | Admitting: Adult Health

## 2016-11-23 DIAGNOSIS — I10 Essential (primary) hypertension: Secondary | ICD-10-CM

## 2016-11-23 DIAGNOSIS — E440 Moderate protein-calorie malnutrition: Secondary | ICD-10-CM | POA: Diagnosis not present

## 2016-11-23 DIAGNOSIS — F0150 Vascular dementia without behavioral disturbance: Secondary | ICD-10-CM

## 2016-11-23 DIAGNOSIS — R600 Localized edema: Secondary | ICD-10-CM | POA: Diagnosis not present

## 2016-11-23 MED ORDER — LORAZEPAM 2 MG/ML IJ SOLN
2.0000 mg | Freq: Four times a day (QID) | INTRAMUSCULAR | 0 refills | Status: DC | PRN
Start: 2016-11-23 — End: 2016-12-05

## 2016-11-23 NOTE — Progress Notes (Signed)
Location:   [facility] Room Number: 151 A Place of Service:  SNF (31)   CODE STATUS: DNR  No Known Allergies  Chief Complaint  Patient presents with  . Medical Management of Chronic Issues    HPI:  She is a long term resident of this facility being seen for the management of her chronic illnesses.  She is unable to fully participate in the hip or ros. There are no nursing concerns at this time. She is spending more time in bed per her choice.    Past Medical History:  Diagnosis Date  . Dementia   . Frequent falls   . Hypertension     Past Surgical History:  Procedure Laterality Date  . FOOT SURGERY    . KIDNEY SURGERY      Social History   Social History  . Marital status: Widowed    Spouse name: N/A  . Number of children: N/A  . Years of education: N/A   Occupational History  . Not on file.   Social History Main Topics  . Smoking status: Never Smoker  . Smokeless tobacco: Never Used  . Alcohol use No  . Drug use: No  . Sexual activity: Not on file   Other Topics Concern  . Not on file   Social History Narrative  . No narrative on file   History reviewed. No pertinent family history.    VITAL SIGNS BP (!) 114/46   Pulse 89   Temp 97 F (36.1 C)   Resp 18   Ht 5\' 3"  (1.6 m)   Wt 81 lb 6.4 oz (36.9 kg)   SpO2 92%   BMI 14.42 kg/m   Patient's Medications  New Prescriptions   No medications on file  Previous Medications   ERGOCALCIFEROL (VITAMIN D2) 50000 UNITS CAPSULE    Take 50,000 Units by mouth once a week. On Fridays   LORAZEPAM (ATIVAN) 2 MG/ML INJECTION    Inject 1 mL (2 mg total) into the muscle every 6 (six) hours as needed.   SERTRALINE (ZOLOFT) 25 MG TABLET    Take 25 mg by mouth daily.   TORSEMIDE (DEMADEX) 20 MG TABLET    Take 20 mg by mouth daily.    TRAZODONE (DESYREL) 50 MG TABLET    Take 50 mg by mouth at bedtime.  Modified Medications   No medications on file  Discontinued Medications   No medications on  file     SIGNIFICANT DIAGNOSTIC EXAMS  10-08-16: left hand x-ray; left fingers osteoarthritis left first finger    LABS REVIEWED:   05-31-16: wbc 6.6 hgb 10.2; hct 31.2 mcv 88.2; plt 272; glucose 98; bun 18; creat 0.72; k+ 4.5; na++ 140 ;liver normal albumin 3.0  07-02-16: wbc 8.9; hgb 12.0; hct 36.8; mcv 89.1; plt 354; glucose 148; bun 33; creat 1.10 ;k+ 4.3; na++ 139 07-03-16: urine culture: no growth  07-05-16: tsh 1.74; vit B12: 237; folate 18.8 10-06-16:wbc 6.6; hgb 9.6; hct 30.8; mcv 94.0; plt 329  glucose 86; bun 22.1; creat 0.73; k+ 4.9; na++ 141; liver normal albumin 2.7 pre-albumin 13    Review of Systems  Unable to perform ROS: Dementia     Physical Exam  Constitutional: No distress.  Frail   Eyes: Conjunctivae are normal.  Neck: Neck supple. No JVD present. No thyromegaly present.  Cardiovascular: Normal rate, regular rhythm and intact distal pulses.   Respiratory: Effort normal and breath sounds normal. No respiratory distress. She has no wheezes.  GI: Soft. Bowel sounds are normal. She exhibits no distension. There is no tenderness.  Musculoskeletal: She exhibits edema.  Able to move all extremities  Trace bilateral lower extremity edema   Lymphadenopathy:    She has no cervical adenopathy.  Neurological: She is alert.  Skin: Skin is warm and dry. She is not diaphoretic.  Left heel  large blister present is improving  Psychiatric: She has a normal mood and affect.      ASSESSMENT/ PLAN:  1. Lower extremity edema: will lower her demadex to 10 mg daily and will monitor her status.   2. Hypertension: is presently stable; is presently not on medications; will not make changes will monitor  3. Vascular dementia: her current weight is 81 pounds; is not on medications; will not make changes will monitor  4. Protein calorie malnutrition: her albumin is 2.7; will continue supplements per facility protocol and will monitor her status.   5. Weight loss: her current  weight is 81 pounds.   Is  related to her advanced age and her vascular dementia; which is an unfortunate outcome of the end stage of this disease process.      Synthia Innocenteborah Green NP [facility]  Contact [phone] Monday through Friday 8am- 5pm  After hours call [phone]

## 2016-11-23 NOTE — Telephone Encounter (Signed)
Prescription request was received from:  [facility]  [address] [place] [address]  PHONE: [phone]   Fax: [phone] 

## 2016-12-05 ENCOUNTER — Encounter: Payer: Self-pay | Admitting: Internal Medicine

## 2016-12-05 ENCOUNTER — Non-Acute Institutional Stay (SKILLED_NURSING_FACILITY): Payer: Medicare Other | Admitting: Internal Medicine

## 2016-12-05 DIAGNOSIS — F0150 Vascular dementia without behavioral disturbance: Secondary | ICD-10-CM

## 2016-12-05 DIAGNOSIS — L03116 Cellulitis of left lower limb: Secondary | ICD-10-CM

## 2016-12-05 NOTE — Progress Notes (Signed)
Patient ID: [name], female   DOB: 03/25/1913, [age] y.o.   MRN: [id number]    DATE: 12/05/2016  [facility]:    [facility] Room Number: 151 A Place of Service: SNF (31)   Extended Emergency Contact Information Primary Emergency Contact: Bond,Nancy Address: [address]          [place], [place] [address] [place] Home Phone: [phone] Mobile Phone: [phone] Relation: Daughter Secondary Emergency Contact: [facility],Robin Address: [address]          [place], [place] [id number] [place] Home Phone: [phone] Mobile Phone: [phone] Relation: Grandaughter  Advanced Directive information Does Patient Have a Medical Advance Directive?: Yes, Type of Advance Directive: Out of facility DNR (pink MOST or yellow form), Does patient want to make changes to medical advance directive?: No - Patient declined  Chief Complaint  Patient presents with  . Acute Visit    possible left leg cellulitis    HPI:  83 yo female long term resident seen today for LLE swelling. Nursing noted increased swelling and redness today. Pt has open sore on LLE that is draining. No recent trauma. Pt also has open sore on left heel. No f/c. weight up 4 lbs in last week. Pt is a poor historian due to dementia. Hx obtained from chart.  Lower extremity edema - currently on demadex to 10 mg daily. Dose recently lowered   Hypertension - diet controlled   Vascular dementia - current weight 85 lbs (prev 81 lb). She does not take any meds for cognition  Protein calorie malnutrition - albumin 2.7; she gets supplements per facility protocol  Weight loss - related to her advanced age and her vascular dementia    Left heel blister - followed by wound care  Past Medical History:  Diagnosis Date  . Dementia   . Frequent falls   . Hypertension     Past Surgical History:  Procedure Laterality Date  . FOOT SURGERY    . KIDNEY SURGERY      Patient Care  Team: No Pcp Per Patient as PCP - General (General Practice)  Social History   Social History  . Marital status: Widowed    Spouse name: N/A  . Number of children: N/A  . Years of education: N/A   Occupational History  . Not on file.   Social History Main Topics  . Smoking status: Never Smoker  . Smokeless tobacco: Never Used  . Alcohol use No  . Drug use: No  . Sexual activity: Not on file   Other Topics Concern  . Not on file   Social History Narrative  . No narrative on file     reports that she has never smoked. She has never used smokeless tobacco. She reports that she does not drink alcohol or use drugs.  History reviewed. No pertinent family history. Unable to obtain due to dementia No family status information on file.    Immunization History  Administered Date(s) Administered  . Influenza-Unspecified 09/19/2016  . PPD Test 05/29/2016, 05/31/2016    No Known Allergies  Medications: Patient's Medications  New Prescriptions   No medications on file  Previous Medications   ERGOCALCIFEROL (VITAMIN D2) 50000 UNITS CAPSULE    Take 50,000 Units by mouth once a week. On Fridays   SERTRALINE (ZOLOFT) 25 MG TABLET    Take 25 mg by mouth daily.   TORSEMIDE (DEMADEX) 20 MG TABLET    Take 10  mg by mouth daily.    TRAZODONE (DESYREL) 50 MG TABLET    Take 50 mg by mouth at bedtime.  Modified Medications   No medications on file  Discontinued Medications   LORAZEPAM (ATIVAN) 2 MG/ML INJECTION    Inject 1 mL (2 mg total) into the muscle every 6 (six) hours as needed.    Review of Systems  Unable to perform ROS: Dementia    Vitals:   12/05/16 1339  BP: (!) 131/52  Pulse: 74  Resp: 18  Temp: 97.7 F (36.5 C)  TempSrc: Oral  Weight: 85 lb 1.6 oz (38.6 kg)  Height: 5\' 3"  (1.6 m)   Body mass index is 15.07 kg/m.  Physical Exam  Constitutional: She appears well-developed.  Frail appearing in NAD, sitting on edge of bed  Cardiovascular: Intact distal  pulses.   No calf TTP. No RLE edema but +1 pitting LLE edema  Musculoskeletal: She exhibits edema.  Neurological: She is alert.  Skin: Skin is warm and dry. No rash noted. No erythema.     Left leg/foot with +1 pitting edema with increased redness and TTP of dorsal foot  Psychiatric: She has a normal mood and affect. Her behavior is normal.     Labs reviewed: Nursing Home on 10/12/2016  Component Date Value Ref Range Status  . Glucose 10/06/2016 86  mg/dL Final  . BUN 62/95/2841 22* 4 - 21 mg/dL Final  . Creatinine 32/44/0102 0.7  0.5 - 1.1 mg/dL Final  . Potassium 72/53/6644 4.9  3.4 - 5.3 mmol/L Final  . Sodium 10/06/2016 141  137 - 147 mmol/L Final  . Hemoglobin 10/06/2016 9.6* 12.0 - 16.0 g/dL Final  . HCT 03/47/4259 31* 36 - 46 % Final  . Platelets 10/06/2016 329  150 - 399 K/L Final  . WBC 10/06/2016 6.6  10^3/mL Final  . Alkaline Phosphatase 10/06/2016 80  25 - 125 U/L Final  . ALT 10/06/2016 7  7 - 35 U/L Final  . AST 10/06/2016 10* 13 - 35 U/L Final  . Bilirubin, Total 10/06/2016 0.2  mg/dL Final  Erroneous Encounter on 09/14/2016  Component Date Value Ref Range Status  . TSH 07/05/2016 1.74  0.41 - 5.90 uIU/mL Final    No results found.   Assessment/Plan   ICD-9-CM ICD-10-CM   1. Cellulitis of left lower extremity 682.6 L03.116   2. Vascular dementia without behavioral disturbance 290.40 F01.50    She was tx with Doxy in Nov 2017 for left heel infection. Will send wound cx and s. Start doxy 100mg  BID x 10 days. Will adjust abx pending cx results. Wound care as ordered.   Cont other meds as ordered  Cont nutritional supplements as ordered  Will follow  Grantley Savage S. Ancil Linsey  [facility] [address] [place], [place] [address] [phone] Cell (Monday-Friday 8 AM - 5 PM) [phone] After 5 PM and follow prompts

## 2017-01-01 ENCOUNTER — Non-Acute Institutional Stay (SKILLED_NURSING_FACILITY): Payer: Medicare Other | Admitting: Adult Health

## 2017-01-01 ENCOUNTER — Encounter: Payer: Self-pay | Admitting: Adult Health

## 2017-01-01 DIAGNOSIS — I10 Essential (primary) hypertension: Secondary | ICD-10-CM | POA: Diagnosis not present

## 2017-01-01 DIAGNOSIS — E440 Moderate protein-calorie malnutrition: Secondary | ICD-10-CM | POA: Diagnosis not present

## 2017-01-01 DIAGNOSIS — R634 Abnormal weight loss: Secondary | ICD-10-CM

## 2017-01-01 DIAGNOSIS — R600 Localized edema: Secondary | ICD-10-CM | POA: Diagnosis not present

## 2017-01-01 NOTE — Progress Notes (Addendum)
[facility]    Place of Service:  SNF (31)   CODE STATUS: dnr  No Known Allergies  Chief Complaint  Patient presents with  . Medical Management of Chronic Issues    HPI:  She is a long term resident of this facility being seen for the management of her chronic illnesses. She has had 2 removed at the end of Jan. She is unable to fully participate in the hpi or ros; but did tell that she aches pains all over at times. There are no nursing concerns at this time.    Past Medical History:  Diagnosis Date  . Dementia   . Frequent falls   . Hypertension     Past Surgical History:  Procedure Laterality Date  . FOOT SURGERY    . KIDNEY SURGERY      Social History   Social History  . Marital status: Widowed    Spouse name: N/A  . Number of children: N/A  . Years of education: N/A   Occupational History  . Not on file.   Social History Main Topics  . Smoking status: Never Smoker  . Smokeless tobacco: Never Used  . Alcohol use No  . Drug use: No  . Sexual activity: Not on file   Other Topics Concern  . Not on file   Social History Narrative  . No narrative on file   No family history on file.    VITAL SIGNS BP (!) 132/59   Pulse 76   Temp 97 F (36.1 C)   Resp 16   Ht 5\' 3"  (1.6 m)   Wt 87 lb (39.5 kg)   SpO2 92%   BMI 15.41 kg/m   Patient's Medications  New Prescriptions   No medications on file  Previous Medications   ERGOCALCIFEROL (VITAMIN D2) 50000 UNITS CAPSULE    Take 50,000 Units by mouth once a week. On Fridays   SERTRALINE (ZOLOFT) 25 MG TABLET    Take 25 mg by mouth daily.   TORSEMIDE (DEMADEX) 20 MG TABLET    Take 10 mg by mouth daily.    TRAZODONE (DESYREL) 50 MG TABLET    Take 25 mg by mouth at bedtime.   Modified Medications   No medications on file  Discontinued Medications   No medications on file     SIGNIFICANT DIAGNOSTIC EXAMS   10-08-16: left hand x-ray; left fingers osteoarthritis left first finger     LABS REVIEWED:   05-31-16: wbc 6.6 hgb 10.2; hct 31.2 mcv 88.2; plt 272; glucose 98; bun 18; creat 0.72; k+ 4.5; na++ 140 ;liver normal albumin 3.0  07-02-16: wbc 8.9; hgb 12.0; hct 36.8; mcv 89.1; plt 354; glucose 148; bun 33; creat 1.10 ;k+ 4.3; na++ 139 07-03-16: urine culture: no growth  07-05-16: tsh 1.74; vit B12: 237; folate 18.8 10-06-16:wbc 6.6; hgb 9.6; hct 30.8; mcv 94.0; plt 329  glucose 86; bun 22.1; creat 0.73; k+ 4.9; na++ 141; liver normal albumin 2.7 pre-albumin 13    Review of Systems  Unable to perform ROS: Dementia     Physical Exam  Constitutional: No distress.  Frail   Eyes: Conjunctivae are normal.  Neck: Neck supple. No JVD present. No thyromegaly present.  Cardiovascular: Normal rate, regular rhythm and intact distal pulses.   Respiratory: Effort normal and breath sounds normal. No respiratory distress. She has no wheezes.  GI: Soft. Bowel sounds are normal. She exhibits no distension. There is no tenderness.  Musculoskeletal: She  exhibits no edema  Able to move all extremities    Lymphadenopathy:    She has no cervical adenopathy.  Neurological: She is alert.  Skin: Skin is warm and dry. She is not diaphoretic.  Left lower extremity skin tear  Left heel: unstaged: 2.0 x 1.5 cm medihoney  Psychiatric: She has a normal mood and affect.      ASSESSMENT/ PLAN:  1. Lower extremity edema: will lower her demadex to 5 mg daily and will monitor her status.   2. Hypertension: is presently stable; is presently not on medications; will not make changes will monitor  3. Vascular dementia: her current weight is 87 pounds; is not on medications; will not make changes will monitor  4. Protein calorie malnutrition: her albumin is 2.7; will continue supplements per facility protocol and will monitor her status.   5. Weight loss: her current weight is 87 pounds.  Her advanced age and her end stage dementia are playing role in her weight loss. The end stage of  dementia weight loss is a sad but expected outcome.    Synthia Innocent NP [facility]  Contact [phone] Monday through Friday 8am- 5pm  After hours call [phone]

## 2017-02-22 ENCOUNTER — Encounter (HOSPITAL_COMMUNITY)
Admission: EM | Disposition: A | Discharge status: Nursing Home (Includes ICF) | Payer: Self-pay | Source: Home / Self Care | Attending: Emergency Medicine

## 2017-02-22 ENCOUNTER — Emergency Department (HOSPITAL_COMMUNITY): Payer: Medicare Other

## 2017-02-22 ENCOUNTER — Encounter (HOSPITAL_COMMUNITY): Payer: Self-pay | Admitting: Emergency Medicine

## 2017-02-22 ENCOUNTER — Observation Stay (HOSPITAL_COMMUNITY): Payer: Medicare Other | Admitting: Anesthesiology

## 2017-02-22 ENCOUNTER — Observation Stay (HOSPITAL_COMMUNITY): Payer: Medicare Other

## 2017-02-22 ENCOUNTER — Observation Stay (HOSPITAL_COMMUNITY)
Admission: EM | Admit: 2017-02-22 | Discharge: 2017-02-22 | Disposition: A | Discharge status: Other Acute Inpatient Hospital | Payer: Medicare Other | Attending: Family Medicine | Admitting: Family Medicine

## 2017-02-22 DIAGNOSIS — I10 Essential (primary) hypertension: Secondary | ICD-10-CM

## 2017-02-22 DIAGNOSIS — S43014A Anterior dislocation of right humerus, initial encounter: Secondary | ICD-10-CM | POA: Diagnosis not present

## 2017-02-22 DIAGNOSIS — E440 Moderate protein-calorie malnutrition: Secondary | ICD-10-CM

## 2017-02-22 DIAGNOSIS — Z66 Do not resuscitate: Secondary | ICD-10-CM | POA: Diagnosis not present

## 2017-02-22 DIAGNOSIS — E46 Unspecified protein-calorie malnutrition: Secondary | ICD-10-CM | POA: Insufficient documentation

## 2017-02-22 DIAGNOSIS — Z79899 Other long term (current) drug therapy: Secondary | ICD-10-CM | POA: Insufficient documentation

## 2017-02-22 DIAGNOSIS — Z9889 Other specified postprocedural states: Secondary | ICD-10-CM | POA: Diagnosis not present

## 2017-02-22 DIAGNOSIS — W06XXXA Fall from bed, initial encounter: Secondary | ICD-10-CM | POA: Insufficient documentation

## 2017-02-22 DIAGNOSIS — D649 Anemia, unspecified: Secondary | ICD-10-CM | POA: Insufficient documentation

## 2017-02-22 DIAGNOSIS — Z9181 History of falling: Secondary | ICD-10-CM | POA: Diagnosis not present

## 2017-02-22 DIAGNOSIS — S43004A Unspecified dislocation of right shoulder joint, initial encounter: Secondary | ICD-10-CM | POA: Diagnosis present

## 2017-02-22 DIAGNOSIS — S43006A Unspecified dislocation of unspecified shoulder joint, initial encounter: Secondary | ICD-10-CM | POA: Diagnosis present

## 2017-02-22 DIAGNOSIS — S4291XA Fracture of right shoulder girdle, part unspecified, initial encounter for closed fracture: Secondary | ICD-10-CM | POA: Insufficient documentation

## 2017-02-22 LAB — I-STAT CHEM 8, ED
BUN: 32 mg/dL — AB (ref 6–20)
CHLORIDE: 102 mmol/L (ref 101–111)
Calcium, Ion: 1.19 mmol/L (ref 1.15–1.40)
Creatinine, Ser: 0.8 mg/dL (ref 0.44–1.00)
Glucose, Bld: 115 mg/dL — ABNORMAL HIGH (ref 65–99)
HCT: 29 % — ABNORMAL LOW (ref 36.0–46.0)
Hemoglobin: 9.9 g/dL — ABNORMAL LOW (ref 12.0–15.0)
POTASSIUM: 4.3 mmol/L (ref 3.5–5.1)
SODIUM: 140 mmol/L (ref 135–145)
TCO2: 30 mmol/L (ref 0–100)

## 2017-02-22 SURGERY — CLOSED REDUCTION, SHOULDER
Anesthesia: General | Laterality: Right

## 2017-02-22 MED ORDER — PROPOFOL 10 MG/ML IV BOLUS
INTRAVENOUS | Status: DC | PRN
  Administered 2017-02-22: 30 mg via INTRAVENOUS
  Administered 2017-02-22: 10 mg via INTRAVENOUS

## 2017-02-22 MED ORDER — LIDOCAINE HCL (PF) 1 % IJ SOLN
30.0000 mL | Freq: Once | INTRAMUSCULAR | Status: AC
  Administered 2017-02-22: 30 mL
  Filled 2017-02-22: qty 30

## 2017-02-22 MED ORDER — FENTANYL CITRATE (PF) 100 MCG/2ML IJ SOLN: 25.0000 ug | INTRAMUSCULAR | Status: DC | PRN

## 2017-02-22 MED ORDER — HYDRALAZINE HCL 20 MG/ML IJ SOLN: 10.0000 mg | Freq: Three times a day (TID) | INTRAMUSCULAR | Status: DC | PRN

## 2017-02-22 MED ORDER — SUCCINYLCHOLINE CHLORIDE 20 MG/ML IJ SOLN
INTRAMUSCULAR | Status: DC | PRN
  Administered 2017-02-22: 40 mg via INTRAVENOUS

## 2017-02-22 MED ORDER — LACTATED RINGERS IV SOLN
INTRAVENOUS | Status: DC | PRN
  Administered 2017-02-22: 09:00:00 via INTRAVENOUS

## 2017-02-22 MED ORDER — FENTANYL CITRATE (PF) 100 MCG/2ML IJ SOLN
50.0000 ug | Freq: Once | INTRAMUSCULAR | Status: AC
  Administered 2017-02-22: 50 ug via INTRAVENOUS
  Filled 2017-02-22: qty 2

## 2017-02-22 NOTE — Op Note (Signed)
02/22/2017  9:20 AM  PATIENT:   Sara Caldwell  [age] y.o. female  PRE-OPERATIVE DIAGNOSIS:  right shoulder dislocation  POST-OPERATIVE DIAGNOSIS:  same  PROCEDURE:  Closed reduction  SURGEON:  English Tomer, Vania ReaKevin M. M.D.  ASSISTANTS: Dale DurhamMichael Jeffries, pac   ANESTHESIA:   Iv sedation  EBL: none  SPECIMEN:  none  Drains: none   PATIENT DISPOSITION:  PACU - hemodynamically stable.    PLAN OF CARE: Discharge to home after PACU  Dictation# [id number]   Contact # [phone]

## 2017-02-22 NOTE — Anesthesia Preprocedure Evaluation (Signed)
Anesthesia Evaluation  Patient identified by MRN, date of birth, ID band Patient awake    Reviewed: Allergy & Precautions, NPO status , Patient's Chart, lab work & pertinent test results  History of Anesthesia Complications Negative for: history of anesthetic complications  Airway Mallampati: II  TM Distance: >3 FB Neck ROM: Full    Dental  (+) Edentulous Upper, Edentulous Lower   Pulmonary neg pulmonary ROS,    breath sounds clear to auscultation       Cardiovascular hypertension,  Rhythm:Regular     Neuro/Psych negative neurological ROS     GI/Hepatic negative GI ROS, Neg liver ROS,   Endo/Other  negative endocrine ROS  Renal/GU negative Renal ROS     Musculoskeletal  (+) Arthritis ,   Abdominal   Peds  Hematology  (+) anemia ,   Anesthesia Other Findings   Reproductive/Obstetrics                             Anesthesia Physical Anesthesia Plan  ASA: III  Anesthesia Plan: General   Post-op Pain Management:    Induction: Intravenous  Airway Management Planned: Mask  Additional Equipment: None  Intra-op Plan:   Post-operative Plan:   Informed Consent: I have reviewed the patients History and Physical, chart, labs and discussed the procedure including the risks, benefits and alternatives for the proposed anesthesia with the patient or authorized representative who has indicated his/her understanding and acceptance.   Dental advisory given  Plan Discussed with: Surgeon and CRNA  Anesthesia Plan Comments:         Anesthesia Quick Evaluation

## 2017-02-22 NOTE — ED Triage Notes (Signed)
EMS reports pt fell at 1800 and was x-ray'd at an extended care facility. EMS reports portable x-ray showed a dislocated shoulder. EMS reports fall was not witnessed. EMS administered of Fentanyl while in route. EMS reports after medication administration oxygen saturation dropped and they placed her on oxygen via nasal cannula.

## 2017-02-22 NOTE — Consult Note (Signed)
Reason for Consult:Right shoulder dislocation Referring Physician: [name] is an 83 y.o. female.  HPI: Sara Caldwell fell at a nursing home. She was brought to [facility] and diagnosed with a shoulder dislocation and humeral head fx. Attempts to reduce the shoulder in the ED were unsuccessful. The EDP was uncomfortable with procedural sedation given her age and frailty and requested reduction in the OR. She has severe dementia and is unable to contribute to history or participate in exam.  Past Medical History:  Diagnosis Date  . Dementia   . Frequent falls   . Hypertension     Past Surgical History:  Procedure Laterality Date  . FOOT SURGERY    . KIDNEY SURGERY      No family history on file.  Social History:  reports that she has never smoked. She has never used smokeless tobacco. She reports that she does not drink alcohol or use drugs.  Allergies: No Known Allergies  Medications: I have reviewed the patient's current medications.  Results for orders placed or performed during the hospital encounter of 02/22/17 (from the past 48 hour(s))  I-stat chem 8, ed     Status: Abnormal   Collection Time: 02/22/17  7:42 AM  Result Value Ref Range   Sodium 140 135 - 145 mmol/L   Potassium 4.3 3.5 - 5.1 mmol/L   Chloride 102 101 - 111 mmol/L   BUN 32 (H) 6 - 20 mg/dL   Creatinine, Ser 1.61 0.44 - 1.00 mg/dL   Glucose, Bld 096 (H) 65 - 99 mg/dL   Calcium, Ion 0.45 4.09 - 1.40 mmol/L   TCO2 30 0 - 100 mmol/L   Hemoglobin 9.9 (L) 12.0 - 15.0 g/dL   HCT 81.1 (L) 91.4 - 78.2 %    Dg Shoulder Right  Result Date: 02/22/2017 CLINICAL DATA:  83 year old post fall with right shoulder pain. EXAM: RIGHT SHOULDER - 2+ VIEW COMPARISON:  Single view shoulder radiograph earlier this day. FINDINGS: Anterior dislocation of the humeral head with respect to the glenoid. Impaction fracture suspect to the lateral humeral head. Displaced fracture fragment adjacent to the lateral  humeral head and inferior glenoid, donor site uncertain. Acromioclavicular joint is congruent. IMPRESSION: Anterior shoulder dislocation. Fracture fragment adjacent to the lateral humeral head and inferior glenoid, donor site uncertain. Humeral head is favored. Electronically Signed   By: Rubye Oaks M.D.   On: 02/22/2017 05:25   Dg Shoulder Right Port  Result Date: 02/22/2017 CLINICAL DATA:  83 y/o  F; fall with shoulder dislocation. EXAM: PORTABLE RIGHT SHOULDER COMPARISON:  None. FINDINGS: Shoulder dislocation, likely anterior. Mild irregularity of the inferior glenoid rim may represent a nondisplaced Bankart fracture. IMPRESSION: Shoulder dislocation, likely anterior, single view radiograph. Mild irregularity of the inferior glenoid rim may represent a nondisplaced Bankart fracture. Electronically Signed   By: Mitzi Hansen M.D.   On: 02/22/2017 01:39    Review of Systems  Unable to perform ROS: Dementia   Blood pressure (!) 124/42, pulse 62, temperature 97.9 F (36.6 C), temperature source Oral, resp. rate 18, height 4\' 10"  (1.473 m), weight 39.5 kg (87 lb), SpO2 98 %. Physical Exam  Constitutional: She appears cachectic. She is sleeping.  HENT:  Head: Normocephalic and atraumatic.  Cardiovascular: Normal rate and regular rhythm.   Respiratory: Effort normal. No respiratory distress.  GI: Soft.  Musculoskeletal:  Right shoulder dislocated anteriorly, elbow, wrist, digits- no skin wounds, nontender, no instability, no blocks to motion  Sens  Unable to  assess  Mot   Unable to assess  Rad 2+  Leftshoulder, elbow, wrist, digits- no skin wounds, nontender, no instability, no blocks to motion  Sens  Unable to assess  Mot   Unable to assess  Rad 2+  LLE No traumatic wounds, ecchymosis, or rash  Nontender  No effusions  Knee stable to varus/ valgus and anterior/posterior stress  Sens Unable to assess  Motor Unable to assess  DP 2+  RLE No traumatic wounds,  ecchymosis, or rash  Nontender  No effusions  Knee stable to varus/ valgus and anterior/posterior stress  Sens unable to assess  Motor Unable to assess  DP 2+         Lymphadenopathy:    She has no cervical adenopathy.  Skin: Skin is warm and dry.    Assessment/Plan: Fall Right shoulder dislocation -- Will attempt to reduce in PACU with anesthesia. Right humeral head fx -- Will discuss with MD    Sara CaldronMichael J. Fany Cavanaugh, PA-C Orthopedic Surgery [phone] 02/22/2017, 8:14 AM

## 2017-02-22 NOTE — ED Notes (Signed)
Called Ortho for the 3rd time- ty

## 2017-02-22 NOTE — H&P (Signed)
[facility] History and Physical  Sara Caldwell Shellenbarger WUJ:[id number] DOB: 01/11/1913 DOA: 02/22/2017  Referring physician: PCP: Kirt Boysarter, Monica, DO   Chief Complaint: anterior humerus   HPI: Sara Caldwell Cupo is a [age] y.o. female  Pt with pmhx sig for falls, severe dementia, htn. [facility] pt has been in her normal state of health. She falls regularly so the fall last night at 630p was not surprising. Her shoulder appeared disfigured so she received an XR. It showed dislocation of the shoulder. Pt sent to ED for tx.  ED Course: ED XR showed dislocation with poss small fracture. Reduction was attempted but failed. Ortho consulted. Ortho asked hospitalist admit patient.    Review of Systems:  Limited ROS due to severe dementia.    Past Medical History:  Diagnosis Date  . Dementia   . Frequent falls   . Hypertension    Past Surgical History:  Procedure Laterality Date  . FOOT SURGERY    . KIDNEY SURGERY     Social History:  reports that she has never smoked. She has never used smokeless tobacco. She reports that she does not drink alcohol or use drugs.  No Known Allergies  No family history on file.   Prior to Admission medications   Medication Sig Start Date End Date Taking? Authorizing Provider  cholecalciferol (VITAMIN D) 1000 units tablet Take 1,000 Units by mouth daily.   Yes Historical Provider, MD  loratadine (CLARITIN) 10 MG tablet Take 10 mg by mouth daily.   Yes Historical Provider, MD  Multiple Vitamin (MULTIVITAMIN WITH MINERALS) TABS tablet Take 1 tablet by mouth daily. DECUBI-VITE   Yes Historical Provider, MD  OVER THE COUNTER MEDICATION Take 4 oz by mouth 3 (three) times daily. House nutritional treat   Yes Historical Provider, MD  Protein POWD Take 2 scoop by mouth 2 (two) times daily.   Yes Historical Provider, MD  sertraline (ZOLOFT) 25 MG tablet Take 25 mg by mouth daily.   Yes Historical Provider, MD  torsemide (DEMADEX) 20 MG tablet  Take 10 mg by mouth daily.    Yes Historical Provider, MD  traZODone (DESYREL) 50 MG tablet Take 25 mg by mouth at bedtime.    Yes Historical Provider, MD   Physical Exam: Vitals:   02/22/17 0730 02/22/17 0745 02/22/17 0800 02/22/17 0832  BP: (!) 140/59 (!) 123/44 (!) 124/42 (!) (P) 153/55  Pulse: 83 (!) 59 62 (P) 60  Resp:   18 (P) 12  Temp:    (P) 97.9 F (36.6 C)  TempSrc:      SpO2: 98% 98% 98% (P) 99%  Weight:      Height:        Wt Readings from Last 3 Encounters:  02/22/17 39.5 kg (87 lb)  01/01/17 39.5 kg (87 lb)  12/05/16 38.6 kg (85 lb 1.6 oz)    General:  Appears calm and comfortable Eyes:  PERRL, EOMI, normal lids, iris ENT:  grossly normal hearing, lips & tongue Neck:  no LAD, masses or thyromegaly Cardiovascular:  RRR, no m/r/g. No LE edema.  Respiratory:  CTA bilaterally, no w/r/r. Normal respiratory effort. Abdomen:  soft, ntnd Skin:  no rash or induration seen on limited exam Musculoskeletal:  grossly normal tone BUE/BLE, except ant displaced R humerus Psychiatric:  sev dementia makes it diff to assess Neurologic:  CN 2-12 grossly intact, moves all extrs          Labs on Admission:  Basic Metabolic Panel:  Recent  Labs Lab 02/22/17 0742  NA 140  K 4.3  CL 102  GLUCOSE 115*  BUN 32*  CREATININE 0.80   Liver Function Tests: No results for input(s): AST, ALT, ALKPHOS, BILITOT, PROT, ALBUMIN in the last 168 hours. No results for input(s): LIPASE, AMYLASE in the last 168 hours. No results for input(s): AMMONIA in the last 168 hours. CBC:  Recent Labs Lab 02/22/17 0742  HGB 9.9*  HCT 29.0*   Cardiac Enzymes: No results for input(s): CKTOTAL, CKMB, CKMBINDEX, TROPONINI in the last 168 hours.  BNP (last 3 results) No results for input(s): BNP in the last 8760 hours.  ProBNP (last 3 results) No results for input(s): PROBNP in the last 8760 hours.   Serum creatinine: 0.8 mg/dL 40/98/11 9147 Estimated creatinine clearance: 22.7  mL/min  CBG: No results for input(s): GLUCAP in the last 168 hours.  Radiological Exams on Admission: Dg Shoulder Right  Result Date: 02/22/2017 CLINICAL DATA:  83 year old post fall with right shoulder pain. EXAM: RIGHT SHOULDER - 2+ VIEW COMPARISON:  Single view shoulder radiograph earlier this day. FINDINGS: Anterior dislocation of the humeral head with respect to the glenoid. Impaction fracture suspect to the lateral humeral head. Displaced fracture fragment adjacent to the lateral humeral head and inferior glenoid, donor site uncertain. Acromioclavicular joint is congruent. IMPRESSION: Anterior shoulder dislocation. Fracture fragment adjacent to the lateral humeral head and inferior glenoid, donor site uncertain. Humeral head is favored. Electronically Signed   By: Rubye Oaks M.D.   On: 02/22/2017 05:25   Dg Shoulder Right Port  Result Date: 02/22/2017 CLINICAL DATA:  83 y/o  F; fall with shoulder dislocation. EXAM: PORTABLE RIGHT SHOULDER COMPARISON:  None. FINDINGS: Shoulder dislocation, likely anterior. Mild irregularity of the inferior glenoid rim may represent a nondisplaced Bankart fracture. IMPRESSION: Shoulder dislocation, likely anterior, single view radiograph. Mild irregularity of the inferior glenoid rim may represent a nondisplaced Bankart fracture. Electronically Signed   By: Mitzi Hansen M.D.   On: 02/22/2017 01:39    EKG: pending  Assessment/Plan Principal Problem:   Shoulder dislocation Active Problems:   Essential hypertension, benign   Protein-calorie malnutrition (HCC)   Anemia   Shoulder dislocation, Right w/ humeral fracture Ortho consulted by EDP, they will try to reduce today Lidoderm patch prn for pain  Hypertension When necessary hydralazine 10 mg IV as needed for severe blood pressure  Malnutrition Meal assistance  Anemia, chronic Will monitor Hgb on admit 9.9  Dementia/Mood Cont zoloft, trazodone  Lower extr edema Hold  demadex while npo  Code Status: DNR  DVT Prophylaxis: TED Hose Family Communication: Dgtr enroute Disposition Plan: Pending Improvement  Status: med surg obs  Haydee Salter, MD Family Medicine [facility] [email or web address] Password [email or web address]

## 2017-02-22 NOTE — Consult Note (Signed)
Reason for Consult:R shoulder d/l Referring Physician: EDP  HPI: Sara Caldwell is an [age] y.o. female nursing home resident s/p fall with R shoulder d/l, attempted closed reduction in ER unsuccessful.  Past Medical History:  Diagnosis Date  . Dementia   . Frequent falls   . Hypertension     Past Surgical History:  Procedure Laterality Date  . FOOT SURGERY    . KIDNEY SURGERY      No family history on file.  Social History:  reports that she has never smoked. She has never used smokeless tobacco. She reports that she does not drink alcohol or use drugs.  Allergies: No Known Allergies  Medications: I have reviewed the patient's current medications.  Results for orders placed or performed during the hospital encounter of 02/22/17 (from the past 48 hour(s))  I-stat chem 8, ed     Status: Abnormal   Collection Time: 02/22/17  7:42 AM  Result Value Ref Range   Sodium 140 135 - 145 mmol/L   Potassium 4.3 3.5 - 5.1 mmol/L   Chloride 102 101 - 111 mmol/L   BUN 32 (H) 6 - 20 mg/dL   Creatinine, Ser 1.610.80 0.44 - 1.00 mg/dL   Glucose, Bld 096115 (H) 65 - 99 mg/dL   Calcium, Ion 0.451.19 4.091.15 - 1.40 mmol/L   TCO2 30 0 - 100 mmol/L   Hemoglobin 9.9 (L) 12.0 - 15.0 g/dL   HCT 81.129.0 (L) 91.436.0 - 78.246.0 %    Dg Shoulder Right  Result Date: 02/22/2017 CLINICAL DATA:  83 year old post fall with right shoulder pain. EXAM: RIGHT SHOULDER - 2+ VIEW COMPARISON:  Single view shoulder radiograph earlier this day. FINDINGS: Anterior dislocation of the humeral head with respect to the glenoid. Impaction fracture suspect to the lateral humeral head. Displaced fracture fragment adjacent to the lateral humeral head and inferior glenoid, donor site uncertain. Acromioclavicular joint is congruent. IMPRESSION: Anterior shoulder dislocation. Fracture fragment adjacent to the lateral humeral head and inferior glenoid, donor site uncertain. Humeral head is favored. Electronically Signed   By: Rubye OaksMelanie  Ehinger M.D.    On: 02/22/2017 05:25   Dg Shoulder Right Port  Result Date: 02/22/2017 CLINICAL DATA:  [age] y/o  F; fall with shoulder dislocation. EXAM: PORTABLE RIGHT SHOULDER COMPARISON:  None. FINDINGS: Shoulder dislocation, likely anterior. Mild irregularity of the inferior glenoid rim may represent a nondisplaced Bankart fracture. IMPRESSION: Shoulder dislocation, likely anterior, single view radiograph. Mild irregularity of the inferior glenoid rim may represent a nondisplaced Bankart fracture. Electronically Signed   By: Mitzi HansenLance  Furusawa-Stratton M.D.   On: 02/22/2017 01:39     Vitals Temp:  [97.9 F (36.6 C)] 97.9 F (36.6 C) (03/29 0832) Pulse Rate:  [59-84] 70 (03/29 0905) Resp:  [12-22] 18 (03/29 0905) BP: (123-165)/(42-101) 135/47 (03/29 0905) SpO2:  [97 %-100 %] 99 % (03/29 0905) Weight:  [39.5 kg (87 lb)] 39.5 kg (87 lb) (03/29 0044) Body mass index is 18.18 kg/m.  Physical Exam: thin WF, R shoulder with anterior dislocation     Assessment/Plan: Impression: R shoulder anterior dislocation Plan: closed reduction with IV sedation  Sara Caldwell M Sara Caldwell 02/22/2017, 9:14 AM  Contact # [phone]

## 2017-02-22 NOTE — Progress Notes (Signed)
Orthopedic Tech Progress Note Patient Details:  Sara DadJeanette Caldwell 06/24/1913 [id number]  Ortho Devices Type of Ortho Device: Arm sling Ortho Device/Splint Interventions: Application   Saul FordyceJennifer C Muskaan Smet 02/22/2017, 9:57 AM

## 2017-02-22 NOTE — Anesthesia Procedure Notes (Signed)
Performed by: Val EagleMOSER, Arlean Thies Pre-anesthesia Checklist: Patient identified, Emergency Drugs available, Suction available, Patient being monitored and Timeout performed Patient Re-evaluated:Patient Re-evaluated prior to inductionOxygen Delivery Method: Ambu bag Preoxygenation: Pre-oxygenation with 100% oxygen Intubation Type: IV induction Ventilation: Mask ventilation without difficulty Dental Injury: Teeth and Oropharynx as per pre-operative assessment

## 2017-02-22 NOTE — Discharge Instructions (Signed)
Use sling on right arm for 1 month.

## 2017-02-22 NOTE — ED Notes (Signed)
Re-paged ortho- ty

## 2017-02-22 NOTE — Transfer of Care (Signed)
Immediate Anesthesia Transfer of Care Note  Patient: Mathis DadJeanette Satterly  Procedure(s) Performed: Procedure(s): CLOSED REDUCTION SHOULDER (Right)  Patient Location: PACU  Anesthesia Type:General  Level of Consciousness: sedated and patient cooperative  Airway & Oxygen Therapy: Patient Spontanous Breathing and Patient connected to nasal cannula oxygen  Post-op Assessment: Report given to RN and Post -op Vital signs reviewed and stable  Post vital signs: Reviewed and stable  Last Vitals:  Vitals:   02/22/17 0832 02/22/17 0905  BP: (!) 153/55 (!) 135/47  Pulse: 60 70  Resp: 12 18  Temp: 36.6 C     Last Pain:  Vitals:   02/22/17 0421  TempSrc:   PainSc: 0-No pain         Complications: No apparent anesthesia complications

## 2017-02-22 NOTE — ED Provider Notes (Signed)
[facility]-EMERGENCY DEPT Provider Note   CSN: [id number] Arrival date & time: 02/22/17  0036  By signing my name below, I, Elder Negus, attest that this documentation has been prepared under the direction and in the presence of Zadie Rhine, MD. Electronically Signed: Elder Negus, Scribe. 02/22/17. 12:45 AM.   History   Chief Complaint No chief complaint on file.  LEVEL 5 CAVEAT DUE TO DEMENTIA  HPI Sara Caldwell is a 83 y.o. female with history of dementia and hypertension who presents to the ED following a fall from skilled nursing facility. According to nursing report, this patient had an unwitnessed fall at 1800. At 1900 she had plain films of the R shoulder which read as dislocation. She received of Fentanyl pre-hospital. Arrives with signed DNR.   A complete history is limited by dementia.   The history is provided by the nursing home and the EMS personnel. No language interpreter was used.    Past Medical History:  Diagnosis Date  . Dementia   . Frequent falls   . Hypertension     Patient Active Problem List   Diagnosis Date Noted  . Loss of weight 07/03/2016  . Osteopenia determined by x-ray 06/07/2016  . Primary osteoarthritis of left knee 06/07/2016  . Cerebrovascular disease 06/07/2016  . Essential hypertension, benign 06/02/2016  . Protein-calorie malnutrition (HCC) 06/02/2016  . Bilateral lower extremity edema 06/02/2016  . Vascular dementia without behavioral disturbance 06/02/2016  . Frequent falls 06/02/2016    Past Surgical History:  Procedure Laterality Date  . FOOT SURGERY    . KIDNEY SURGERY      OB History    No data available       Home Medications    Prior to Admission medications   Medication Sig Start Date End Date Taking? Authorizing Provider  ergocalciferol (VITAMIN D2) 50000 UNITS capsule Take 50,000 Units by mouth once a week. On Fridays    Historical Provider, MD  sertraline (ZOLOFT) 25 MG tablet Take 25 mg  by mouth daily.    Historical Provider, MD  torsemide (DEMADEX) 20 MG tablet Take 10 mg by mouth daily.     Historical Provider, MD  traZODone (DESYREL) 50 MG tablet Take 25 mg by mouth at bedtime.     Historical Provider, MD    Family History No family history on file.  Social History Social History  Substance Use Topics  . Smoking status: Never Smoker  . Smokeless tobacco: Never Used  . Alcohol use No     Allergies   Patient has no known allergies.   Review of Systems Review of Systems  Unable to perform ROS: Dementia     Physical Exam Updated Vital Signs Pulse 84   Temp 97.9 F (36.6 C) (Oral)   Resp (!) 22   Ht 4\' 10"  (1.473 m)   Wt 87 lb (39.5 kg)   SpO2 98%   BMI 18.18 kg/m   Physical Exam CONSTITUTIONAL: Elderly and fraile  HEAD: Normocephalic/atraumatic EYES: EOMI/PERRL ENMT: Mucous membranes moist NECK: supple no meningeal signs SPINE/BACK:entire spine nontender, kyphotic CV: S1/S2 noted, no murmurs/rubs/gallops noted LUNGS: Lungs are clear to auscultation bilaterally, no apparent distress ABDOMEN: soft, nontender, no rebound or guarding, bowel sounds noted throughout abdomen NEURO: Pt is awake/alert/ moves all extremitiesx4. Patient is confused.    EXTREMITIES: pulses normal/equal. Tenderness and deformity to the R shoulder, All other extremities/joints palpated/ranged and nontender SKIN: warm, color normal PSYCH: unable to assess   ED Treatments / Results  Labs (  all labs ordered are listed, but only abnormal results are displayed) Labs Reviewed  I-STAT CHEM 8, ED - Abnormal; Notable for the following:       Result Value   BUN 32 (*)    Glucose, Bld 115 (*)    Hemoglobin 9.9 (*)    HCT 29.0 (*)    All other components within normal limits    EKG  EKG Interpretation None       Radiology No results found.  Procedures .Nerve Block Date/Time: 02/22/2017 3:00 AM Performed by: Zadie RhineWICKLINE, Jhania Etherington Authorized by: Zadie RhineWICKLINE, Zed Wanninger    Consent:    Consent obtained:  Written   Consent given by:  Healthcare agent   Alternatives discussed:  No treatment Indications:    Indications:  Pain relief and procedural anesthesia Location:    Body area:  Upper extremity   Upper extremity nerve blocked: shoulder/joint.   Laterality:  Right Pre-procedure details:    Skin preparation:  Povidone-iodine Procedure details (see MAR for exact dosages):    Block needle gauge:  25 G   Anesthetic injected:  Lidocaine 1% w/o epi   Injection procedure:  Anatomic landmarks identified Post-procedure details:    Dressing:  None   Outcome:  Anesthesia achieved   Patient tolerance of procedure:  Tolerated well, no immediate complications     Reduction of dislocation Date/Time:  Performed by: Joya GaskinsWICKLINE,Yeny Schmoll W Authorized by: Joya GaskinsWICKLINE,Peighton Mehra W Consent: written consent by daughter via phone Risks and benefits: risks, benefits and alternatives were discussed Consent given by: daughter - POA Required items: required devices, and special equipment available Time out: Immediately prior to procedure a "time out" was called to verify the correct patient, procedure, equipment, support staff and site/side marked as required.  Patient sedated: fentanyl only  Vitals: Vital signs were monitored during sedation. Patient tolerance: Patient tolerated the procedure well with no immediate complications. Joint: right shoulder Reduction technique: traction/counter-traction This was unsuccessful after multiple attempt     Medications Ordered in ED Medications  lidocaine (PF) (XYLOCAINE) 1 % injection 30 mL (30 mLs Other Given 02/22/17 0258)  fentaNYL (SUBLIMAZE) injection 50 mcg (50 mcg Intravenous Given 02/22/17 0414)     Initial Impression / Assessment and Plan / ED Course  I have reviewed the triage vital signs and the nursing notes.  Pertinent  imaging results that were available during my care of the patient were reviewed by me and  considered in my medical decision making (see chart for details).     5:47 AM Multiple attempts to reduce the right shoulder were unsuccessful I did not utilize procedural sedation due to age and risk factors I utilized lidocaine intra-articular as well as small dose of fentanyl I was still unable to reduce fracture I have consulted orthopedics Pt is neurovascularly  Intact in right UE 7:44 AM D/w dr Roda ShuttersXu We discussed that we were unable to reduce shoulder and given age/health issues and frailty, I don't feel comfortable placing under procedural sedation for reduction He requests to admit to medical service and he will check on patient later and reduce shoulder this afternoon  D/w daughter, nancy bond who will come to see the patient   D/w [facility] who will admit patient  Final Clinical Impressions(s) / ED Diagnoses   Final diagnoses:  Traumatic closed displaced fracture of right shoulder with anterior dislocation, initial encounter    New Prescriptions New Prescriptions   No medications on file  I personally performed the services described in this documentation, which was scribed  in my presence. The recorded information has been reviewed and is accurate.       Zadie Rhine, MD 02/22/17 [phone]

## 2017-02-23 NOTE — Anesthesia Postprocedure Evaluation (Signed)
Anesthesia Post Note  Patient: Sara Caldwell  Procedure(s) Performed: * No procedures listed *  Patient location during evaluation: PACU Anesthesia Type: General Level of consciousness: awake Pain management: pain level controlled Vital Signs Assessment: post-procedure vital signs reviewed and stable Respiratory status: spontaneous breathing, nonlabored ventilation, respiratory function stable and patient connected to nasal cannula oxygen Cardiovascular status: blood pressure returned to baseline and stable Postop Assessment: no signs of nausea or vomiting Anesthetic complications: no       Last Vitals: There were no vitals filed for this visit.  Last Pain: There were no vitals filed for this visit.               Roldan Laforest

## 2017-02-23 NOTE — Op Note (Signed)
NAMEMarland Kitchen  LYNDI, HOLBEIN NO.:  [id number]  MEDICAL RECORD NO.:  [id number]  LOCATION:  B17C                         FACILITY:  [facility]  PHYSICIAN:  Vania Rea. Lynnzie Blackson, M.D.  DATE OF BIRTH:  03/09/1913  DATE OF PROCEDURE:  02/22/2017 DATE OF DISCHARGE:                              OPERATIVE REPORT   PREOPERATIVE DIAGNOSIS:  Right shoulder anterior dislocation.  POSTOPERATIVE DIAGNOSIS:  Right shoulder anterior dislocation.  PROCEDURE:  Closed reduction with IV sedation.  SURGEON:  Vania Rea. Vasilisa Vore, M.D.  ASSISTANT:  Earney Hamburg, PA-C.  ANESTHESIA:  IV sedation, administered by the Anesthesia Department.  HISTORY:  Ms. Steptoe is a 83 year old female, resident of a local skilled nursing facility, fell from bed late last night with immediate complaints of right shoulder pain.  She was brought to the emergency room, where evaluation showed deformity about the right shoulder.  X- rays confirmed an anteroinferior dislocation.  She had an initial attempt at closed reduction in the emergency department without success. Orthopedics has been consulted, and subsequently, the patient was brought to the PACU for administration of IV sedation, relaxation, and attempted re-closed reduction.  Previously, the family's __________ she had obtained proper consent for closed reduction.  PROCEDURE IN DETAIL:  After appropriate monitoring, the patient did receive IV sedation and relaxation.  Once appropriate relaxation was achieved, we performed a gentle reduction maneuver, longitudinal traction on the right arm with visible relocation of the shoulder.  Once reduction was achieved, shoulder was taken through range of motion showing stable repositioning without crepitus or gross instability. Postreduction x-ray showed the joint to be congruently aligned.  The postop plan will be for sling immobilization for the first month. At that 1 month, she may come out of her sling and  resume ad lib use of the right arm.  Contact our office if any specific questions that we may assist with.    Vania Rea. Heath Badon, M.D.    KMS/MEDQ  D:  02/22/2017  T:  02/22/2017  Job:  [id number]

## 2017-02-28 ENCOUNTER — Non-Acute Institutional Stay (SKILLED_NURSING_FACILITY): Payer: Medicare Other | Admitting: Internal Medicine

## 2017-02-28 ENCOUNTER — Encounter: Payer: Self-pay | Admitting: Internal Medicine

## 2017-02-28 DIAGNOSIS — S43004D Unspecified dislocation of right shoulder joint, subsequent encounter: Secondary | ICD-10-CM | POA: Diagnosis not present

## 2017-02-28 NOTE — Patient Instructions (Signed)
See Plan in 02/28/17 Note

## 2017-02-28 NOTE — Progress Notes (Signed)
Patient ID: Sara Caldwell, female   DOB: [birth year], 83 y.o.   MRN: [id number]   This is a nursing facility follow up for orthopedic procedure in PACU.  Interim medical record and care since last [facility] visit was updated with review of diagnostic studies and change in clinical status since last visit were documented.  HPI: The patient had closed reduction under IV sedation of the right shoulder anterior dislocation 02/22/17 by Dr. Rennis Chris in the PACU after failed to close reduction in the emergency room  Patient had fallen from her bed at the SNF evening of 3/28. Imaging had confirmed anterior inferior dislocation. Postoperatively the patient will have sling immobilization for one month The patient returned to the SNF within 24 hours..  Patient has history of frequent falls in the context of dementia.  Review of systems: Dementia invalidated responses. She was unable to give me the date or the name of the president. She did name her daughter and give me her daughter's birthday. She does deny any cardiac or neurologic prodrome prior to the fall, but again history is invalid. She stated that she had some pain down her back yesterday but not now.  Physical exam:  Pertinent or positive findings:She is very frail, she appears her stated age. She is edentulous. Grade 1/2 systolic murmurs present. Breath sounds are decreased. She is very hard of hearing.  Pedal pulses are decreased. She has mixed arthritic changes of her hands. She has limb atrophy and interosseous wasting. She is generally weak. Her  R upper extremity strength was not tested.   General appearance:Adequately nourished; no acute distress , increased work of breathing is present.   Lymphatic: No lymphadenopathy about the head, neck, axilla . Eyes: No conjunctival inflammation or lid edema is present. There is no scleral icterus. Ears:  External ear exam shows no significant lesions or deformities.   Nose:  External  nasal examination shows no deformity or inflammation. Nasal mucosa are pink and moist without lesions ,exudates Oral exam: lips and gums are healthy appearing.There is no oropharyngeal erythema or exudate . Neck:  No thyromegaly, masses, tenderness noted.    Heart:  Normal rate and regular rhythm. S1 and S2 normal without gallop,click, rub .  Lungs:Chest clear to auscultation without wheezes, rhonchi,rales , rubs. Abdomen:Bowel sounds are normal. Abdomen is soft and nontender with no organomegaly, hernias,masses. GU: deferred  Extremities:  No cyanosis, clubbing,edema  Neurologic exam :  Balance,Rhomberg,finger to nose testing could not be completed due to clinical state Skin: Warm & dry w/o tenting. No significant lesions or rash.  #1 status post closed reduction of right shoulder dislocation without evidence of clinical complication #2 frequent falls due to dementia. No evidence of cardio or neurologic prodrome as factor. Plan: As per orthopedics, sling 1 month. Safety measures discussed with staff & daughter. DNR code status reconfirmed with daughter

## 2017-05-01 ENCOUNTER — Non-Acute Institutional Stay (SKILLED_NURSING_FACILITY): Payer: Medicare Other

## 2017-05-01 DIAGNOSIS — Z0000 Encounter for general adult medical examination without abnormal findings: Secondary | ICD-10-CM

## 2017-05-01 NOTE — Progress Notes (Signed)
Subjective:   Sara Caldwell is a [age] y.o. female who presents for an Initial Medicare Annual Wellness Visit [facility]     Objective:    Today's Vitals   05/01/17 1152  BP: (!) 102/54  Pulse: 77  Temp: 97.1 F (36.2 C)  TempSrc: Oral  SpO2: 96%  Weight: 87 lb (39.5 kg)  Height: 4\' 10"  (1.473 m)   Body mass index is 18.18 kg/m.   Current Medications (verified) Outpatient Encounter Prescriptions as of 05/01/2017  Medication Sig  . cholecalciferol (VITAMIN D) 1000 units tablet Take 1,000 Units by mouth daily.  Marland Kitchen. loratadine (CLARITIN) 10 MG tablet Take 10 mg by mouth daily.  . Multiple Vitamin (MULTIVITAMIN WITH MINERALS) TABS tablet Take 1 tablet by mouth daily. DECUBI-VITE  . OVER THE COUNTER MEDICATION Take 4 oz by mouth 3 (three) times daily. House nutritional treat  . Protein POWD Take 2 scoop by mouth 2 (two) times daily.  . sertraline (ZOLOFT) 25 MG tablet Take 25 mg by mouth daily.  Marland Kitchen. torsemide (DEMADEX) 20 MG tablet Take 10 mg by mouth daily.   . traZODone (DESYREL) 50 MG tablet Take 25 mg by mouth at bedtime.    No facility-administered encounter medications on file as of 05/01/2017.     Allergies (verified) Patient has no known allergies.   History: Past Medical History:  Diagnosis Date  . Dementia   . Frequent falls   . Hypertension    Past Surgical History:  Procedure Laterality Date  . FOOT SURGERY    . KIDNEY SURGERY     History reviewed. No pertinent family history. Social History   Occupational History  . Not on file.   Social History Main Topics  . Smoking status: Never Smoker  . Smokeless tobacco: Never Used  . Alcohol use No  . Drug use: No  . Sexual activity: Not on file    Tobacco Counseling Counseling given: Not Answered   Activities of Daily Living In your present state of health, do you have any difficulty performing the following activities: 05/01/2017  Hearing? N  Vision? N  Difficulty concentrating or  making decisions? Y  Walking or climbing stairs? Y  Dressing or bathing? Y  Doing errands, shopping? Y  Preparing Food and eating ? Y  Using the Toilet? Y  In the past six months, have you accidently leaked urine? Y  Do you have problems with loss of bowel control? Y  Managing your Medications? Y  Managing your Finances? Y  Housekeeping or managing your Housekeeping? Y  Some recent data might be hidden    Immunizations and Health Maintenance Immunization History  Administered Date(s) Administered  . Influenza-Unspecified 09/19/2016  . PPD Test 05/29/2016, 05/31/2016   There are no preventive care reminders to display for this patient.  Patient Care Team: Kirt Boysarter, Monica, DO as PCP - [facility]  Indicate any recent Medical Services you may have received from other than [facility] providers in the past year (date may be approximate).     Assessment:   This is a routine wellness examination for Sara Caldwell.   Hearing/Vision screen No exam data present  Dietary issues and exercise activities discussed: Current Exercise Habits: The patient does not participate in regular exercise at present, Exercise limited by: None identified  Goals    . Maintain Lifestyle          Pt will maintain lifestyle.  Depression Screen PHQ 2/9 Scores 05/01/2017  PHQ - 2 Score 0    Fall Risk Fall Risk  05/01/2017 03/08/2017  Falls in the past year? Yes Yes  Number falls in past yr: 2 or more 1  Injury with Fall? Yes Yes  Risk Factor Category  - High Fall Risk  Risk for fall due to : - History of fall(s);Impaired balance/gait;Impaired mobility;Impaired vision;Medication side effect  Follow up - Falls evaluation completed    Cognitive Function:     6CIT Screen 05/01/2017  What Year? 4 points  What month? 3 points  What time? 3 points  Count back from 20 4 points  Months in reverse 4 points  Repeat phrase 10 points    Total Score 28    Screening Tests Health Maintenance  Topic Date Due  . DEXA SCAN  07/10/2017 (Originally 09/03/1980)  . TETANUS/TDAP  07/10/2018 (Originally 09/03/1934)  . PNA vac Low Risk Adult (1 of 2 - PCV13) 07/10/2018 (Originally 09/03/1980)  . INFLUENZA VACCINE  06/27/2017      Plan:    I have personally reviewed and addressed the Medicare Annual Wellness questionnaire and have noted the following in the patient's chart:  A. Medical and social history B. Use of alcohol, tobacco or illicit drugs  C. Current medications and supplements D. Functional ability and status E.  Nutritional status F.  Physical activity G. Advance directives H. List of other physicians I.  Hospitalizations, surgeries, and ER visits in previous 12 months J.  Vitals K. Screenings to include hearing, vision, cognitive, depression L. Referrals and appointments - none  In addition, I have reviewed and discussed with patient certain preventive protocols, quality metrics, and best practice recommendations. A written personalized care plan for preventive services as well as general preventive health recommendations were provided to patient.  See attached scanned questionnaire for additional information.   Signed,   Annetta Maw, RN Nurse Health Advisor   Quick Notes   Health Maintenance:  PNA 13, DEXA due     Abnormal Screen: 6 CIT-28     Patient Concerns: None     Nurse Concerns: None

## 2017-05-01 NOTE — Patient Instructions (Signed)
Sara Caldwell , Thank you for taking time to come for your Medicare Wellness Visit. I appreciate your ongoing commitment to your health goals. Please review the following plan we discussed and let me know if I can assist you in the future.   Screening recommendations/referrals: Colonoscopy pt over age 81 Mammogram pt over age 175 Bone Density due Recommended yearly ophthalmology/optometry visit for glaucoma screening and checkup Recommended yearly dental visit for hygiene and checkup  Vaccinations: Influenza vaccine due 09/19/2017 Pneumococcal vaccine 13 due Tdap vaccine not in records Shingles vaccine not in records  Advanced directives: DNR in chart  Conditions/risks identified: None  Next appointment: None upcoming   Preventive Care 65 Years and Older, Female Preventive care refers to lifestyle choices and visits with your health care provider that can promote health and wellness. What does preventive care include?  A yearly physical exam. This is also called an annual well check.  Dental exams once or twice a year.  Routine eye exams. Ask your health care provider how often you should have your eyes checked.  Personal lifestyle choices, including:  Daily care of your teeth and gums.  Regular physical activity.  Eating a healthy diet.  Avoiding tobacco and drug use.  Limiting alcohol use.  Practicing safe sex.  Taking low-dose aspirin every day.  Taking vitamin and mineral supplements as recommended by your health care provider. What happens during an annual well check? The services and screenings done by your health care provider during your annual well check will depend on your age, overall health, lifestyle risk factors, and family history of disease. Counseling  Your health care provider may ask you questions about your:  Alcohol use.  Tobacco use.  Drug use.  Emotional well-being.  Home and relationship well-being.  Sexual activity.  Eating  habits.  History of falls.  Memory and ability to understand (cognition).  Work and work Astronomerenvironment.  Reproductive health. Screening  You may have the following tests or measurements:  Height, weight, and BMI.  Blood pressure.  Lipid and cholesterol levels. These may be checked every 5 years, or more frequently if you are over 81 years old.  Skin check.  Lung cancer screening. You may have this screening every year starting at age 81 if you have a 30-pack-year history of smoking and currently smoke or have quit within the past 15 years.  Fecal occult blood test (FOBT) of the stool. You may have this test every year starting at age 81.  Flexible sigmoidoscopy or colonoscopy. You may have a sigmoidoscopy every 5 years or a colonoscopy every 10 years starting at age 81.  Hepatitis C blood test.  Hepatitis B blood test.  Sexually transmitted disease (STD) testing.  Diabetes screening. This is done by checking your blood sugar (glucose) after you have not eaten for a while (fasting). You may have this done every 1-3 years.  Bone density scan. This is done to screen for osteoporosis. You may have this done starting at age 81.  Mammogram. This may be done every 1-2 years. Talk to your health care provider about how often you should have regular mammograms. Talk with your health care provider about your test results, treatment options, and if necessary, the need for more tests. Vaccines  Your health care provider may recommend certain vaccines, such as:  Influenza vaccine. This is recommended every year.  Tetanus, diphtheria, and acellular pertussis (Tdap, Td) vaccine. You may need a Td booster every 10 years.  Zoster vaccine. You  may need this after age 48.  Pneumococcal 13-valent conjugate (PCV13) vaccine. One dose is recommended after age 42.  Pneumococcal polysaccharide (PPSV23) vaccine. One dose is recommended after age 11. Talk to your health care provider about which  screenings and vaccines you need and how often you need them. This information is not intended to replace advice given to you by your health care provider. Make sure you discuss any questions you have with your health care provider. Document Released: 12/10/2015 Document Revised: 08/02/2016 Document Reviewed: 09/14/2015 Elsevier Interactive Patient Education  2017 [place] Prevention in the Home Falls can cause injuries. They can happen to people of all ages. There are many things you can do to make your home safe and to help prevent falls. What can I do on the outside of my home?  Regularly fix the edges of walkways and driveways and fix any cracks.  Remove anything that might make you trip as you walk through a door, such as a raised step or threshold.  Trim any bushes or trees on the path to your home.  Use bright outdoor lighting.  Clear any walking paths of anything that might make someone trip, such as rocks or tools.  Regularly check to see if handrails are loose or broken. Make sure that both sides of any steps have handrails.  Any raised decks and porches should have guardrails on the edges.  Have any leaves, snow, or ice cleared regularly.  Use sand or salt on walking paths during winter.  Clean up any spills in your garage right away. This includes oil or grease spills. What can I do in the bathroom?  Use night lights.  Install grab bars by the toilet and in the tub and shower. Do not use towel bars as grab bars.  Use non-skid mats or decals in the tub or shower.  If you need to sit down in the shower, use a plastic, non-slip stool.  Keep the floor dry. Clean up any water that spills on the floor as soon as it happens.  Remove soap buildup in the tub or shower regularly.  Attach bath mats securely with double-sided non-slip rug tape.  Do not have throw rugs and other things on the floor that can make you trip. What can I do in the bedroom?  Use  night lights.  Make sure that you have a light by your bed that is easy to reach.  Do not use any sheets or blankets that are too big for your bed. They should not hang down onto the floor.  Have a firm chair that has side arms. You can use this for support while you get dressed.  Do not have throw rugs and other things on the floor that can make you trip. What can I do in the kitchen?  Clean up any spills right away.  Avoid walking on wet floors.  Keep items that you use a lot in easy-to-reach places.  If you need to reach something above you, use a strong step stool that has a grab bar.  Keep electrical cords out of the way.  Do not use floor polish or wax that makes floors slippery. If you must use wax, use non-skid floor wax.  Do not have throw rugs and other things on the floor that can make you trip. What can I do with my stairs?  Do not leave any items on the stairs.  Make sure that there are handrails on both  sides of the stairs and use them. Fix handrails that are broken or loose. Make sure that handrails are as long as the stairways.  Check any carpeting to make sure that it is firmly attached to the stairs. Fix any carpet that is loose or worn.  Avoid having throw rugs at the top or bottom of the stairs. If you do have throw rugs, attach them to the floor with carpet tape.  Make sure that you have a light switch at the top of the stairs and the bottom of the stairs. If you do not have them, ask someone to add them for you. What else can I do to help prevent falls?  Wear shoes that:  Do not have high heels.  Have rubber bottoms.  Are comfortable and fit you well.  Are closed at the toe. Do not wear sandals.  If you use a stepladder:  Make sure that it is fully opened. Do not climb a closed stepladder.  Make sure that both sides of the stepladder are locked into place.  Ask someone to hold it for you, if possible.  Clearly mark and make sure that you  can see:  Any grab bars or handrails.  First and last steps.  Where the edge of each step is.  Use tools that help you move around (mobility aids) if they are needed. These include:  Canes.  Walkers.  Scooters.  Crutches.  Turn on the lights when you go into a dark area. Replace any light bulbs as soon as they burn out.  Set up your furniture so you have a clear path. Avoid moving your furniture around.  If any of your floors are uneven, fix them.  If there are any pets around you, be aware of where they are.  Review your medicines with your doctor. Some medicines can make you feel dizzy. This can increase your chance of falling. Ask your doctor what other things that you can do to help prevent falls. This information is not intended to replace advice given to you by your health care provider. Make sure you discuss any questions you have with your health care provider. Document Released: 09/09/2009 Document Revised: 04/20/2016 Document Reviewed: 12/18/2014 Elsevier Interactive Patient Education  2017 [employer].

## 2017-05-08 ENCOUNTER — Other Ambulatory Visit: Payer: Self-pay | Admitting: Internal Medicine

## 2017-05-08 DIAGNOSIS — E2839 Other primary ovarian failure: Secondary | ICD-10-CM

## 2017-05-18 ENCOUNTER — Other Ambulatory Visit: Payer: Medicare Other

## 2017-09-22 ENCOUNTER — Emergency Department (HOSPITAL_COMMUNITY)
Admission: EM | Admit: 2017-09-22 | Discharge: 2017-09-22 | Disposition: A | Discharge status: Home / Self Care | Payer: Medicare Other | Attending: Emergency Medicine | Admitting: Emergency Medicine

## 2017-09-22 ENCOUNTER — Emergency Department (HOSPITAL_COMMUNITY): Payer: Medicare Other

## 2017-09-22 ENCOUNTER — Encounter (HOSPITAL_COMMUNITY): Payer: Self-pay | Admitting: Emergency Medicine

## 2017-09-22 DIAGNOSIS — W19XXXA Unspecified fall, initial encounter: Secondary | ICD-10-CM | POA: Insufficient documentation

## 2017-09-22 DIAGNOSIS — S01111A Laceration without foreign body of right eyelid and periocular area, initial encounter: Secondary | ICD-10-CM | POA: Diagnosis not present

## 2017-09-22 DIAGNOSIS — H05231 Hemorrhage of right orbit: Secondary | ICD-10-CM | POA: Diagnosis not present

## 2017-09-22 DIAGNOSIS — F0390 Unspecified dementia without behavioral disturbance: Secondary | ICD-10-CM | POA: Diagnosis not present

## 2017-09-22 DIAGNOSIS — Z79899 Other long term (current) drug therapy: Secondary | ICD-10-CM | POA: Diagnosis not present

## 2017-09-22 DIAGNOSIS — S0181XA Laceration without foreign body of other part of head, initial encounter: Secondary | ICD-10-CM | POA: Diagnosis present

## 2017-09-22 DIAGNOSIS — Y998 Other external cause status: Secondary | ICD-10-CM | POA: Diagnosis not present

## 2017-09-22 DIAGNOSIS — Y92122 Bedroom in nursing home as the place of occurrence of the external cause: Secondary | ICD-10-CM | POA: Insufficient documentation

## 2017-09-22 DIAGNOSIS — I10 Essential (primary) hypertension: Secondary | ICD-10-CM | POA: Diagnosis not present

## 2017-09-22 DIAGNOSIS — Y939 Activity, unspecified: Secondary | ICD-10-CM | POA: Insufficient documentation

## 2017-09-22 MED ORDER — LIDOCAINE HCL (PF) 2 % IJ SOLN
INTRAMUSCULAR | Status: AC
  Filled 2017-09-22: qty 10

## 2017-09-22 NOTE — ED Notes (Signed)
[facility] was called for pt's transportation back to [facility].

## 2017-09-22 NOTE — ED Triage Notes (Signed)
Brought in by EMS from [facility] with c/o head injury after her unwitnessed fall tonight.  Pt was found lying on floor beside bed---- sustained laceration to right forehead and skin tear to right forearm, bleeding controlled to affected areas with pressure dressings.

## 2017-09-22 NOTE — ED Notes (Signed)
Bed: [id number] Expected date:  Expected time:  Means of arrival:  Comments: EMS from [facility]-hx Alzheimers-unwitnessed fall-hit head-lac right brow and hematoma

## 2017-09-22 NOTE — ED Provider Notes (Signed)
[facility]-EMERGENCY DEPT Provider Note: Lowella Dell, MD, FACEP  CSN: [id number] MRN: [id number] ARRIVAL: 09/22/17 at 0100 ROOM: WA10/WA10   CHIEF COMPLAINT  Head Injury  Level 5 caveat: Dementia HISTORY OF PRESENT ILLNESS  09/22/17 4:27 AM Sara Caldwell is a 83 y.o. female with a history of dementia and frequent falls.  She was brought in from her living facility after presumably having an unwitnessed fall.  She was found lying on the floor beside her bed.  She has a laceration to her right forehead with periorbital ecchymosis.  She also has a skin tear to her right forearm.  Bleeding has been controlled with pressure.  She has been awake and alert to her baseline.  She has not been vomiting.   Past Medical History:  Diagnosis Date  . Dementia   . Frequent falls   . Hypertension     Past Surgical History:  Procedure Laterality Date  . FOOT SURGERY    . KIDNEY SURGERY      History reviewed. No pertinent family history.  Social History  Substance Use Topics  . Smoking status: Never Smoker  . Smokeless tobacco: Never Used  . Alcohol use No    Prior to Admission medications   Medication Sig Start Date End Date Taking? Authorizing Provider  cholecalciferol (VITAMIN D) 1000 units tablet Take 1,000 Units by mouth daily.   Yes [provider]  divalproex (DEPAKOTE SPRINKLE) 125 MG capsule Take 125 mg by mouth 2 (two) times daily.   Yes [provider]  LORazepam (ATIVAN) 0.5 MG tablet Take 0.5 mg by mouth every 12 (twelve) hours as needed for anxiety (agitation).   Yes [provider]  LORazepam (ATIVAN) 2 MG/ML injection Inject 2 mg into the vein every 6 (six) hours as needed for anxiety (agitation).   Yes [provider]  Multiple Vitamin (MULTIVITAMIN WITH MINERALS) TABS tablet Take 1 tablet by mouth daily. DECUBI-VITE   Yes [provider]  OVER THE COUNTER MEDICATION Take 4 oz by mouth 3 (three) times daily. House nutritional  treat   Yes [provider]  Protein POWD Take 2 scoop by mouth 2 (two) times daily.   Yes [provider]  sertraline (ZOLOFT) 50 MG tablet Take 50 mg by mouth daily.    Yes [provider]  torsemide (DEMADEX) 10 MG tablet Take 10 mg by mouth daily.    Yes [provider]  traZODone (DESYREL) 50 MG tablet Take 25 mg by mouth at bedtime.    Yes [provider]    Allergies Patient has no known allergies.   REVIEW OF SYSTEMS     PHYSICAL EXAMINATION  Initial Vital Signs Blood pressure (!) 182/66, pulse 69, temperature (!) 97.4 F (36.3 C), temperature source Oral, resp. rate 19, SpO2 96 %.  Examination General: Well-developed, thin female in no acute distress; appearance consistent with age of record HENT: normocephalic; edentulous; right eyebrow laceration with right periorbital ecchymosis:    Eyes: pupils equal, round and reactive to light; extraocular muscles intact Neck: supple; no C-spine tenderness Heart: regular rate and rhythm Lungs: clear to auscultation bilaterally Abdomen: soft; nondistended; nontender; bowel sounds present Extremities: Arthritic changes of hands; chronic appearing arthritic changes of knees; right leg slightly shortened; no pain on passive range of motion of extremities Neurologic: Awake, alert and oriented x 1; motor function intact in all extremities and symmetric; no facial droop Skin: Warm and dry; ecchymoses of forearms of varying age; superficial skin tears of  right forearm with Steri-Strips in place Psychiatric: Normal mood and affect   RESULTS  Summary of this visit's results, reviewed by myself:   EKG Interpretation  Date/Time:    Ventricular Rate:    PR Interval:    QRS Duration:   QT Interval:    QTC Calculation:   R Axis:     Text Interpretation:        Laboratory Studies: No results found for this or any previous visit (from the past 24 hour(s)). Imaging Studies: Ct Head Wo  Contrast  Result Date: 09/22/2017 CLINICAL DATA:  Unwitnessed fall tonight. Found down by bed. Forehead laceration. Neck pain. EXAM: CT HEAD WITHOUT CONTRAST CT CERVICAL SPINE WITHOUT CONTRAST TECHNIQUE: Multidetector CT imaging of the head and cervical spine was performed following the standard protocol without intravenous contrast. Multiplanar CT image reconstructions of the cervical spine were also generated. COMPARISON:  CT HEAD July 31, 2013 FINDINGS: CT HEAD FINDINGS BRAIN: No intraparenchymal hemorrhage, mass effect nor midline shift. The ventricles and sulci are normal for age. Confluent supratentorial white matter hypodensities. Old RIGHT basal ganglia and bilateral thalamus lacunar infarcts. No acute large vascular territory infarcts. No abnormal extra-axial fluid collections. Basal cisterns are patent. VASCULAR: Severe calcific atherosclerosis of the carotid siphons. SKULL: No skull fracture. Osteopenia. Severe RIGHT temporomandibular osteoarthrosis. RIGHT periorbital to RIGHT frontal soft tissue swelling, no subcutaneous gas or radiopaque foreign bodies. SINUSES/ORBITS: The mastoid air-cells and included paranasal sinuses are well-aerated.The included ocular globes and orbital contents are non-suspicious. OTHER: None. CT CERVICAL SPINE FINDINGS- severe kyphosis limiting evaluation. ALIGNMENT: Maintained lordosis. Grade 1 C5-6 anterolisthesis, grade 1 C6-7 anterolisthesis. SKULL BASE AND VERTEBRAE: Cervical vertebral bodies and posterior elements are intact. Severe C6-7 disc height loss with endplate sclerosis and marginal spurring, mild at C4-5 and C5-6. Multilevel moderate to severe LEFT facet arthropathy. Osteopenia without destructive bony lesions. C1-2 articulation maintained with severe arthropathy. SOFT TISSUES AND SPINAL CANAL: Normal. DISC LEVELS: No significant osseous canal stenosis. Multilevel moderate neural foraminal narrowing. UPPER CHEST: Cardiomegaly and potentially aneurysmal  aorta. OTHER: None. IMPRESSION: CT HEAD: 1. No acute intracranial process. 2. Severe chronic small vessel ischemic disease and old lacunar infarcts. 3. RIGHT periorbital to RIGHT frontal scalp hematoma. No skull fracture. CT CERVICAL SPINE: 1. No acute fracture. Grade 1 C5-6 and C6-7 anterolisthesis on degenerative basis. 2. Cardiomegaly and aneurysmal thoracic aorta versus mass. Recommend chest radiograph as initial evaluation. Electronically Signed   By: Awilda Metroourtnay  Bloomer M.D.   On: 09/22/2017 04:49   Ct Cervical Spine Wo Contrast  Result Date: 09/22/2017 CLINICAL DATA:  Unwitnessed fall tonight. Found down by bed. Forehead laceration. Neck pain. EXAM: CT HEAD WITHOUT CONTRAST CT CERVICAL SPINE WITHOUT CONTRAST TECHNIQUE: Multidetector CT imaging of the head and cervical spine was performed following the standard protocol without intravenous contrast. Multiplanar CT image reconstructions of the cervical spine were also generated. COMPARISON:  CT HEAD July 31, 2013 FINDINGS: CT HEAD FINDINGS BRAIN: No intraparenchymal hemorrhage, mass effect nor midline shift. The ventricles and sulci are normal for age. Confluent supratentorial white matter hypodensities. Old RIGHT basal ganglia and bilateral thalamus lacunar infarcts. No acute large vascular territory infarcts. No abnormal extra-axial fluid collections. Basal cisterns are patent. VASCULAR: Severe calcific atherosclerosis of the carotid siphons. SKULL: No skull fracture. Osteopenia. Severe RIGHT temporomandibular osteoarthrosis. RIGHT periorbital to RIGHT frontal soft tissue swelling, no subcutaneous gas or radiopaque foreign bodies. SINUSES/ORBITS: The mastoid air-cells and included paranasal sinuses are well-aerated.The included ocular globes and orbital contents are non-suspicious. OTHER:  None. CT CERVICAL SPINE FINDINGS- severe kyphosis limiting evaluation. ALIGNMENT: Maintained lordosis. Grade 1 C5-6 anterolisthesis, grade 1 C6-7 anterolisthesis.  SKULL BASE AND VERTEBRAE: Cervical vertebral bodies and posterior elements are intact. Severe C6-7 disc height loss with endplate sclerosis and marginal spurring, mild at C4-5 and C5-6. Multilevel moderate to severe LEFT facet arthropathy. Osteopenia without destructive bony lesions. C1-2 articulation maintained with severe arthropathy. SOFT TISSUES AND SPINAL CANAL: Normal. DISC LEVELS: No significant osseous canal stenosis. Multilevel moderate neural foraminal narrowing. UPPER CHEST: Cardiomegaly and potentially aneurysmal aorta. OTHER: None. IMPRESSION: CT HEAD: 1. No acute intracranial process. 2. Severe chronic small vessel ischemic disease and old lacunar infarcts. 3. RIGHT periorbital to RIGHT frontal scalp hematoma. No skull fracture. CT CERVICAL SPINE: 1. No acute fracture. Grade 1 C5-6 and C6-7 anterolisthesis on degenerative basis. 2. Cardiomegaly and aneurysmal thoracic aorta versus mass. Recommend chest radiograph as initial evaluation. Electronically Signed   By: Awilda Metro M.D.   On: 09/22/2017 04:49   Dg Hip Port Unilat W Or Wo Pelvis 1 View Right  Result Date: 09/22/2017 CLINICAL DATA:  Acute onset of right leg shortening. EXAM: DG HIP (WITH OR WITHOUT PELVIS) 1V PORT RIGHT COMPARISON:  Pelvic radiograph performed 05/05/2015 FINDINGS: There is no definite evidence of fracture or dislocation. The hip joints are symmetric and unremarkable in appearance. There is chronic deformity of the left inferior pubic ramus, reflecting remote injury. The sacroiliac joints are unremarkable. Mild degenerative change is noted along the lower lumbar spine. Scattered vascular calcifications are seen. IMPRESSION: 1. No evidence of acute fracture or dislocation. 2. Scattered vascular calcifications seen. Electronically Signed   By: Roanna Raider M.D.   On: 09/22/2017 05:14    ED COURSE  Nursing notes and initial vitals signs, including pulse oximetry, reviewed.  Vitals:   09/22/17 0114 09/22/17 0130  09/22/17 0200 09/22/17 0506  BP: (!) 182/66 (!) 161/61 (!) 146/73 (!) 150/62  Pulse: 69 70 71 72  Resp: 19   18  Temp: (!) 97.4 F (36.3 C)     TempSrc: Oral     SpO2: 96% 96% 95% 96%    PROCEDURES   LACERATION REPAIR Performed by: Ghina Bittinger L Authorized by: Hanley Seamen Consent: Verbal consent obtained. Risks and benefits: risks, benefits and alternatives were discussed Consent given by: patient Patient identity confirmed: provided demographic data Prepped and Draped in normal sterile fashion Wound explored  Laceration Location: Right eyebrow  Laceration Length: 4 cm  No Foreign Bodies seen or palpated  Anesthesia: None  Irrigation method: syringe Amount of cleaning: standard  Skin closure: Dermabond  Patient tolerance: Patient tolerated the procedure well with no immediate complications.   ED DIAGNOSES     ICD-10-CM   1. Fall, initial encounter W19.XXXA   2. Laceration of eyebrow and forehead, right, initial encounter S01.81XA    S01.111A   3. Periorbital hematoma of right eye H05.231        Paula Libra, MD 09/22/17 [phone]

## 2017-09-22 NOTE — ED Notes (Signed)
[facility] staff was called and given report on pt's condition and discharge.

## 2017-09-22 NOTE — ED Notes (Signed)
[facility] here to transport pt back to [facility].

## 2017-10-29 ENCOUNTER — Emergency Department (HOSPITAL_COMMUNITY): Payer: Medicare Other

## 2017-10-29 ENCOUNTER — Emergency Department (HOSPITAL_COMMUNITY)
Admission: EM | Admit: 2017-10-29 | Discharge: 2017-10-30 | Disposition: A | Discharge status: Home / Self Care | Payer: Medicare Other | Attending: Emergency Medicine | Admitting: Emergency Medicine

## 2017-10-29 ENCOUNTER — Encounter (HOSPITAL_COMMUNITY): Payer: Self-pay | Admitting: Emergency Medicine

## 2017-10-29 DIAGNOSIS — S01111A Laceration without foreign body of right eyelid and periocular area, initial encounter: Secondary | ICD-10-CM | POA: Diagnosis not present

## 2017-10-29 DIAGNOSIS — Y92121 Bathroom in nursing home as the place of occurrence of the external cause: Secondary | ICD-10-CM | POA: Insufficient documentation

## 2017-10-29 DIAGNOSIS — Y999 Unspecified external cause status: Secondary | ICD-10-CM | POA: Diagnosis not present

## 2017-10-29 DIAGNOSIS — I10 Essential (primary) hypertension: Secondary | ICD-10-CM | POA: Insufficient documentation

## 2017-10-29 DIAGNOSIS — S0083XA Contusion of other part of head, initial encounter: Secondary | ICD-10-CM | POA: Diagnosis not present

## 2017-10-29 DIAGNOSIS — S0990XA Unspecified injury of head, initial encounter: Secondary | ICD-10-CM | POA: Diagnosis present

## 2017-10-29 DIAGNOSIS — F0390 Unspecified dementia without behavioral disturbance: Secondary | ICD-10-CM | POA: Insufficient documentation

## 2017-10-29 DIAGNOSIS — Z79899 Other long term (current) drug therapy: Secondary | ICD-10-CM | POA: Diagnosis not present

## 2017-10-29 DIAGNOSIS — W19XXXA Unspecified fall, initial encounter: Secondary | ICD-10-CM | POA: Insufficient documentation

## 2017-10-29 DIAGNOSIS — Y9301 Activity, walking, marching and hiking: Secondary | ICD-10-CM | POA: Diagnosis not present

## 2017-10-29 LAB — CBC WITH DIFFERENTIAL/PLATELET
BASOS ABS: 0 10*3/uL (ref 0.0–0.1)
Basophils Relative: 0 %
Eosinophils Absolute: 0.2 10*3/uL (ref 0.0–0.7)
Eosinophils Relative: 2 %
HEMATOCRIT: 31.5 % — AB (ref 36.0–46.0)
Hemoglobin: 9.9 g/dL — ABNORMAL LOW (ref 12.0–15.0)
LYMPHS ABS: 1.7 10*3/uL (ref 0.7–4.0)
LYMPHS PCT: 19 %
MCH: 26.3 pg (ref 26.0–34.0)
MCHC: 31.4 g/dL (ref 30.0–36.0)
MCV: 83.6 fL (ref 78.0–100.0)
MONO ABS: 0.8 10*3/uL (ref 0.1–1.0)
Monocytes Relative: 9 %
NEUTROS ABS: 6.1 10*3/uL (ref 1.7–7.7)
Neutrophils Relative %: 70 %
Platelets: 333 10*3/uL (ref 150–400)
RBC: 3.77 MIL/uL — ABNORMAL LOW (ref 3.87–5.11)
RDW: 15.8 % — AB (ref 11.5–15.5)
WBC: 8.9 10*3/uL (ref 4.0–10.5)

## 2017-10-29 NOTE — ED Notes (Signed)
FAILED attempt to collect all labs. RN notified

## 2017-10-29 NOTE — ED Notes (Signed)
Daughter, Verlin Festerancy Bond, would like to be called when disposition is decided.

## 2017-10-29 NOTE — ED Notes (Signed)
Patient transported to x-ray. ?

## 2017-10-29 NOTE — ED Provider Notes (Signed)
[facility]-EMERGENCY DEPT Provider Note   CSN: [id number] Arrival date & time: 10/29/17  1445     History   Chief Complaint Chief Complaint  Patient presents with  . Fall    HPI Sara Caldwell is a 83 y.o. female.   The history is provided by medical records (emd report to nursing). The history is limited by the condition of the patient.  Fall  This is a recurrent problem. The current episode started less than 1 hour ago. The problem occurs rarely. The problem has not changed since onset.Associated symptoms include headaches. Pertinent negatives include no chest pain, no abdominal pain and no shortness of breath. Nothing aggravates the symptoms. Nothing relieves the symptoms. She has tried nothing for the symptoms. The treatment provided no relief.   LVL 5 caveat for dementia  Past Medical History:  Diagnosis Date  . Dementia   . Frequent falls   . Hypertension     Patient Active Problem List   Diagnosis Date Noted  . Shoulder dislocation 02/22/2017  . Anemia 02/22/2017  . Hypertension   . Loss of weight 07/03/2016  . Osteopenia determined by x-ray 06/07/2016  . Primary osteoarthritis of left knee 06/07/2016  . Cerebrovascular disease 06/07/2016  . Essential hypertension, benign 06/02/2016  . Protein-calorie malnutrition (HCC) 06/02/2016  . Bilateral lower extremity edema 06/02/2016  . Vascular dementia without behavioral disturbance 06/02/2016  . Frequent falls 06/02/2016    Past Surgical History:  Procedure Laterality Date  . FOOT SURGERY    . KIDNEY SURGERY      OB History    No data available       Home Medications    Prior to Admission medications   Medication Sig Start Date End Date Taking? Authorizing Provider  cholecalciferol (VITAMIN D) 1000 units tablet Take 1,000 Units by mouth daily.    [provider]  divalproex (DEPAKOTE SPRINKLE) 125 MG capsule Take 125 mg by mouth 2 (two) times daily.    [provider]  LORazepam (ATIVAN) 0.5 MG tablet Take 0.5 mg by mouth every 12 (twelve) hours as needed for anxiety (agitation).    [provider]  LORazepam (ATIVAN) 2 MG/ML injection Inject 2 mg into the vein every 6 (six) hours as needed for anxiety (agitation).    [provider]  Multiple Vitamin (MULTIVITAMIN WITH MINERALS) TABS tablet Take 1 tablet by mouth daily. DECUBI-VITE    [provider]  OVER THE COUNTER MEDICATION Take 4 oz by mouth 3 (three) times daily. House nutritional treat    [provider]  Protein POWD Take 2 scoop by mouth 2 (two) times daily.    [provider]  sertraline (ZOLOFT) 50 MG tablet Take 50 mg by mouth daily.     [provider]  torsemide (DEMADEX) 10 MG tablet Take 10 mg by mouth daily.     [provider]  traZODone (DESYREL) 50 MG tablet Take 25 mg by mouth at bedtime.     [provider]    Family History No family history on file.  Social History Social History   Tobacco Use  . Smoking status: Never Smoker  . Smokeless tobacco: Never Used  Substance Use Topics  . Alcohol use: No  . Drug use: No     Allergies   Patient has no known allergies.   Review of Systems Review of Systems  Unable to perform ROS: Dementia  Respiratory: Negative for shortness of breath.   Cardiovascular:  Negative for chest pain.  Gastrointestinal: Negative for abdominal pain.  Neurological: Positive for headaches.     Physical Exam Updated Vital Signs There were no vitals taken for this visit.  Physical Exam  Constitutional: She appears well-developed and well-nourished. No distress.  HENT:  Head:    Right Ear: External ear normal.  Left Ear: External ear normal.  Nose: Nose normal. No nasal deformity or nasal septal hematoma.  Mouth/Throat: Oropharynx is clear and moist. No oropharyngeal exudate.  Patient has contusion and hematoma on right lateral eyebrow and forehead.   Small laceration present.  Hemostatic.  Patient has very delicate and frail skin.  No nasal septal hematoma.  Pupils are reactive bilaterally.  Patient is difficult to assess for full ocular motions.     Eyes: Conjunctivae are normal. Pupils are equal, round, and reactive to light.  Neck: No spinous process tenderness and no muscular tenderness present.  In towel collar No neck tenderness on exam.  Cardiovascular: Normal rate and intact distal pulses.  No murmur heard. Pulmonary/Chest: Effort normal. No stridor. No respiratory distress. She has no wheezes. She exhibits no tenderness.  Abdominal: There is no tenderness.  Musculoskeletal: She exhibits no tenderness.       Arms: Neurological: She is disoriented. She exhibits normal muscle tone. GCS eye subscore is 4. GCS verbal subscore is 4. GCS motor subscore is 6.  Patient is unable to answer questions due to dementia and mental status.  According to EMS report to nursing, patient is at her mental status baseline and only occasionally says yes or no answers to questions.  Patient's motor strength appears frail and weak.  Patient moves all extremities.  Skin: Capillary refill takes less than 2 seconds. No rash noted. She is not diaphoretic. No erythema.  Nursing note and vitals reviewed.    ED Treatments / Results  Labs (all labs ordered are listed, but only abnormal results are displayed) Labs Reviewed  CBC WITH DIFFERENTIAL/PLATELET - Abnormal; Notable for the following components:      Result Value   RBC 3.77 (*)    Hemoglobin 9.9 (*)    HCT 31.5 (*)    RDW 15.8 (*)    All other components within normal limits  BASIC METABOLIC PANEL  PROTIME-INR    EKG  EKG Interpretation None       Radiology Dg Chest 2 View  Result Date: 10/29/2017 CLINICAL DATA:  Pain following fall EXAM: CHEST  2 VIEW COMPARISON:  None. FINDINGS: There is no appreciable edema or consolidation. Heart is upper normal in size with pulmonary  vascularity within normal limits. Aorta is tortuous with atherosclerotic calcification in the aorta. There are multiple foci of coronary artery calcification as well as foci of mitral annulus calcification. There is no appreciable adenopathy. No pneumothorax. Bones are osteoporotic. There is degenerative change in the right shoulder with superior migration of the right humeral head. IMPRESSION: No edema or consolidation. Heart upper normal in size. Tortuous aorta with aortic atherosclerosis. There are foci of coronary artery calcification. Bones osteoporotic. Chronic rotator cuff tear on the right. No pneumothorax evident. Aortic Atherosclerosis (ICD10-I70.0). Electronically Signed   By: Bretta Bang III M.D.   On: 10/29/2017 16:05   Ct Head Wo Contrast  Result Date: 10/29/2017 CLINICAL DATA:  Status post fall. No loss of consciousness. History dementia. EXAM: CT HEAD WITHOUT CONTRAST CT MAXILLOFACIAL WITHOUT CONTRAST CT CERVICAL SPINE WITHOUT CONTRAST TECHNIQUE: Multidetector CT imaging of the head, cervical spine, and maxillofacial structures were performed  using the standard protocol without intravenous contrast. Multiplanar CT image reconstructions of the cervical spine and maxillofacial structures were also generated. COMPARISON:  09/22/2017 FINDINGS: CT HEAD FINDINGS Brain: No evidence of acute infarction, hemorrhage, extra-axial collection, ventriculomegaly, or mass effect. Generalized cerebral atrophy. Periventricular white matter low attenuation likely secondary to microangiopathy. Vascular: Cerebrovascular atherosclerotic calcifications are noted. Skull: Negative for fracture or focal lesion. Sinuses/Orbits: Visualized portions of the orbits are unremarkable. Visualized portions of the paranasal sinuses and mastoid air cells are unremarkable. Other: Right frontal scalp hematoma. CT MAXILLOFACIAL FINDINGS Osseous: No fracture or mandibular dislocation. No destructive process. Severe osteoarthritis  of the right temporomandibular joint. Mild osteoarthritis of the left temporomandibular joint. Orbits: Negative. No traumatic or inflammatory finding. Sinuses: Clear. Soft tissues: Negative. CT CERVICAL SPINE FINDINGS Alignment: 2 mm anterolisthesis of C5 on C6, C6 on C7 and C7 on T1. Skull base and vertebrae: No acute fracture. No primary bone lesion or focal pathologic process. Soft tissues and spinal canal: No prevertebral fluid or swelling. No visible canal hematoma. Disc levels: Degenerative disc disease with disc height loss at C6-7. Bilateral facet arthropathy at C3-4, C4-5, C5-6 and C6-7. Left foraminal stenosis at C3-4. Mild left foraminal stenosis at C4-5. Upper chest: Lung apices are clear. Other: Large heterogeneous left thyroid mass with substernal extension likely reflecting a thyroid goiter. IMPRESSION: 1. No acute intracranial pathology. 2. Right frontal scalp hematoma. 3.  No acute osseous injury of the maxillofacial bones. 4.  No acute osseous injury of the cervical spine. 5. Large heterogeneous left thyroid mass with substernal extension likely reflecting a thyroid goiter. If there is further clinical concern, recommend a dedicated thyroid ultrasound. Electronically Signed   By: Elige KoHetal  Patel   On: 10/29/2017 16:16   Ct Cervical Spine Wo Contrast  Result Date: 10/29/2017 CLINICAL DATA:  Status post fall. No loss of consciousness. History dementia. EXAM: CT HEAD WITHOUT CONTRAST CT MAXILLOFACIAL WITHOUT CONTRAST CT CERVICAL SPINE WITHOUT CONTRAST TECHNIQUE: Multidetector CT imaging of the head, cervical spine, and maxillofacial structures were performed using the standard protocol without intravenous contrast. Multiplanar CT image reconstructions of the cervical spine and maxillofacial structures were also generated. COMPARISON:  09/22/2017 FINDINGS: CT HEAD FINDINGS Brain: No evidence of acute infarction, hemorrhage, extra-axial collection, ventriculomegaly, or mass effect. Generalized cerebral  atrophy. Periventricular white matter low attenuation likely secondary to microangiopathy. Vascular: Cerebrovascular atherosclerotic calcifications are noted. Skull: Negative for fracture or focal lesion. Sinuses/Orbits: Visualized portions of the orbits are unremarkable. Visualized portions of the paranasal sinuses and mastoid air cells are unremarkable. Other: Right frontal scalp hematoma. CT MAXILLOFACIAL FINDINGS Osseous: No fracture or mandibular dislocation. No destructive process. Severe osteoarthritis of the right temporomandibular joint. Mild osteoarthritis of the left temporomandibular joint. Orbits: Negative. No traumatic or inflammatory finding. Sinuses: Clear. Soft tissues: Negative. CT CERVICAL SPINE FINDINGS Alignment: 2 mm anterolisthesis of C5 on C6, C6 on C7 and C7 on T1. Skull base and vertebrae: No acute fracture. No primary bone lesion or focal pathologic process. Soft tissues and spinal canal: No prevertebral fluid or swelling. No visible canal hematoma. Disc levels: Degenerative disc disease with disc height loss at C6-7. Bilateral facet arthropathy at C3-4, C4-5, C5-6 and C6-7. Left foraminal stenosis at C3-4. Mild left foraminal stenosis at C4-5. Upper chest: Lung apices are clear. Other: Large heterogeneous left thyroid mass with substernal extension likely reflecting a thyroid goiter. IMPRESSION: 1. No acute intracranial pathology. 2. Right frontal scalp hematoma. 3.  No acute osseous injury of the maxillofacial bones. 4.  No acute osseous injury of the cervical spine. 5. Large heterogeneous left thyroid mass with substernal extension likely reflecting a thyroid goiter. If there is further clinical concern, recommend a dedicated thyroid ultrasound. Electronically Signed   By: Elige KoHetal  Patel   On: 10/29/2017 16:16   Ct Maxillofacial Wo Contrast  Result Date: 10/29/2017 CLINICAL DATA:  Status post fall. No loss of consciousness. History dementia. EXAM: CT HEAD WITHOUT CONTRAST CT  MAXILLOFACIAL WITHOUT CONTRAST CT CERVICAL SPINE WITHOUT CONTRAST TECHNIQUE: Multidetector CT imaging of the head, cervical spine, and maxillofacial structures were performed using the standard protocol without intravenous contrast. Multiplanar CT image reconstructions of the cervical spine and maxillofacial structures were also generated. COMPARISON:  09/22/2017 FINDINGS: CT HEAD FINDINGS Brain: No evidence of acute infarction, hemorrhage, extra-axial collection, ventriculomegaly, or mass effect. Generalized cerebral atrophy. Periventricular white matter low attenuation likely secondary to microangiopathy. Vascular: Cerebrovascular atherosclerotic calcifications are noted. Skull: Negative for fracture or focal lesion. Sinuses/Orbits: Visualized portions of the orbits are unremarkable. Visualized portions of the paranasal sinuses and mastoid air cells are unremarkable. Other: Right frontal scalp hematoma. CT MAXILLOFACIAL FINDINGS Osseous: No fracture or mandibular dislocation. No destructive process. Severe osteoarthritis of the right temporomandibular joint. Mild osteoarthritis of the left temporomandibular joint. Orbits: Negative. No traumatic or inflammatory finding. Sinuses: Clear. Soft tissues: Negative. CT CERVICAL SPINE FINDINGS Alignment: 2 mm anterolisthesis of C5 on C6, C6 on C7 and C7 on T1. Skull base and vertebrae: No acute fracture. No primary bone lesion or focal pathologic process. Soft tissues and spinal canal: No prevertebral fluid or swelling. No visible canal hematoma. Disc levels: Degenerative disc disease with disc height loss at C6-7. Bilateral facet arthropathy at C3-4, C4-5, C5-6 and C6-7. Left foraminal stenosis at C3-4. Mild left foraminal stenosis at C4-5. Upper chest: Lung apices are clear. Other: Large heterogeneous left thyroid mass with substernal extension likely reflecting a thyroid goiter. IMPRESSION: 1. No acute intracranial pathology. 2. Right frontal scalp hematoma. 3.  No  acute osseous injury of the maxillofacial bones. 4.  No acute osseous injury of the cervical spine. 5. Large heterogeneous left thyroid mass with substernal extension likely reflecting a thyroid goiter. If there is further clinical concern, recommend a dedicated thyroid ultrasound. Electronically Signed   By: Elige KoHetal  Patel   On: 10/29/2017 16:16    Procedures Procedures (including critical care time)  Medications Ordered in ED Medications - No data to display   Initial Impression / Assessment and Plan / ED Course  I have reviewed the triage vital signs and the nursing notes.  Pertinent labs & imaging results that were available during my care of the patient were reviewed by me and considered in my medical decision making (see chart for details).     Sara Caldwell is a [age] y.o. female with a past medical history significant for dementia, hypertension, and recurrent falls who presents with a fall.  According to EMS report to nursing, patient was in her wheelchair when she stood up and fell forward striking her right head on the ground.  Patient did not lose consciousness and was quickly brought back to a sitting position.  Patient had no vomiting.  Report is that patient is at her mental status baseline and she only intermittently answers questions with yes and no answers.  Patient has severe dementia and lives at a nursing facility.  They report that patient had no preceding symptoms aside from getting out of her chair.  They deny patient having recent fevers, coughs, urine  changes, or bowel movement changes.  On exam, patient is in a towel collar.  Patient had large hematoma to her right forehead with swelling.  Patient has abrasion/laceration to the right eyebrow that is approximately 2 cm and hemostatic.  Edges are well approximated.  Patient had minimal tenderness across her forehead.  No nasal septal hematoma.  Patient did not appear to have tenderness to the anterior face with no patient  had reactive and symmetric pupils.  Patient would not follow directions to assess full extraocular motions.  Ears had no evidence of trauma with no blood in the ear canals.  No tenderness on the neck.  Lungs clear and chest nontender.  Abdomen nontender.  Patient moves all extremities.  Patient had skin tear/abrasion to her right arm.  Patient will have imaging of the head, face, and neck.  Patient will have screening laboratory testing and chest x-ray.  Due to reports that patient is at her mental status baseline and needs to be evaluated for her head injury, suspect patient will be safe for discharge home if imaging is reassuring.  Will reassess wounds after imaging.  Patient CT imaging showed no fractures or dislocations.  There is also evidence of a thyroid mass.  Patient will need to follow-up with PCP for further management and ultrasound if she feels it is necessary.  Patient had no other complaints and had no other evidence of injuries.  Patient's wound was reexamined and there was a small laceration that was shallow.  Due to patient's frail skin, decision made to use Steri-Strips to treated as it was very well approximated.  Wound repair occurred without difficulty.    Patient will be discharged home back to her facility for further management by her PCP.   Final Clinical Impressions(s) / ED Diagnoses   Final diagnoses:  Fall, initial encounter  Contusion of face, initial encounter  Laceration of right eyebrow, initial encounter    ED Discharge Orders    None     Clinical Impression: 1. Fall, initial encounter   2. Contusion of face, initial encounter   3. Laceration of right eyebrow, initial encounter     Disposition: Discharge  Condition: Good  I have discussed the results, Dx and Tx plan with the pt(& family if present). He/she/they expressed understanding and agree(s) with the plan. Discharge instructions discussed at great length. Strict return precautions discussed  and pt &/or family have verbalized understanding of the instructions. No further questions at time of discharge.    This SmartLink is deprecated. Use AVSMEDLIST instead to display the medication list for a patient.  Follow Up: [facility] [address] [id number] mc [place] [address] [phone]  If symptoms worsen      Vivia Rosenburg, Canary Brim, MD 10/29/17 2356

## 2017-10-29 NOTE — ED Notes (Signed)
Steri strips applied to patients right eye laceration.

## 2017-10-29 NOTE — ED Triage Notes (Signed)
Per [facility] pt from [facility] after having a witnessed fall by roommate. Reported that she fell face first onto ground while attempting to use restroom. Pt is non ambulatory and dementia at baseline. No LOC no blood thinners. Pt has hematoma and laceration above right eye. Skin tear to left wrist.

## 2017-10-29 NOTE — ED Notes (Signed)
[facility] Expected date:  Expected time:  Means of arrival:  Comments: [id number]-[id number] y/o

## 2017-10-29 NOTE — ED Notes (Signed)
[facility] called for transportation  

## 2018-01-16 IMAGING — CR DG CHEST 2V
3 series · 3 of 3 positions shown · non-contrast
Comparison: None.

CLINICAL DATA: Pain following fall

EXAM:
CHEST  2 VIEW

[w chest lat]
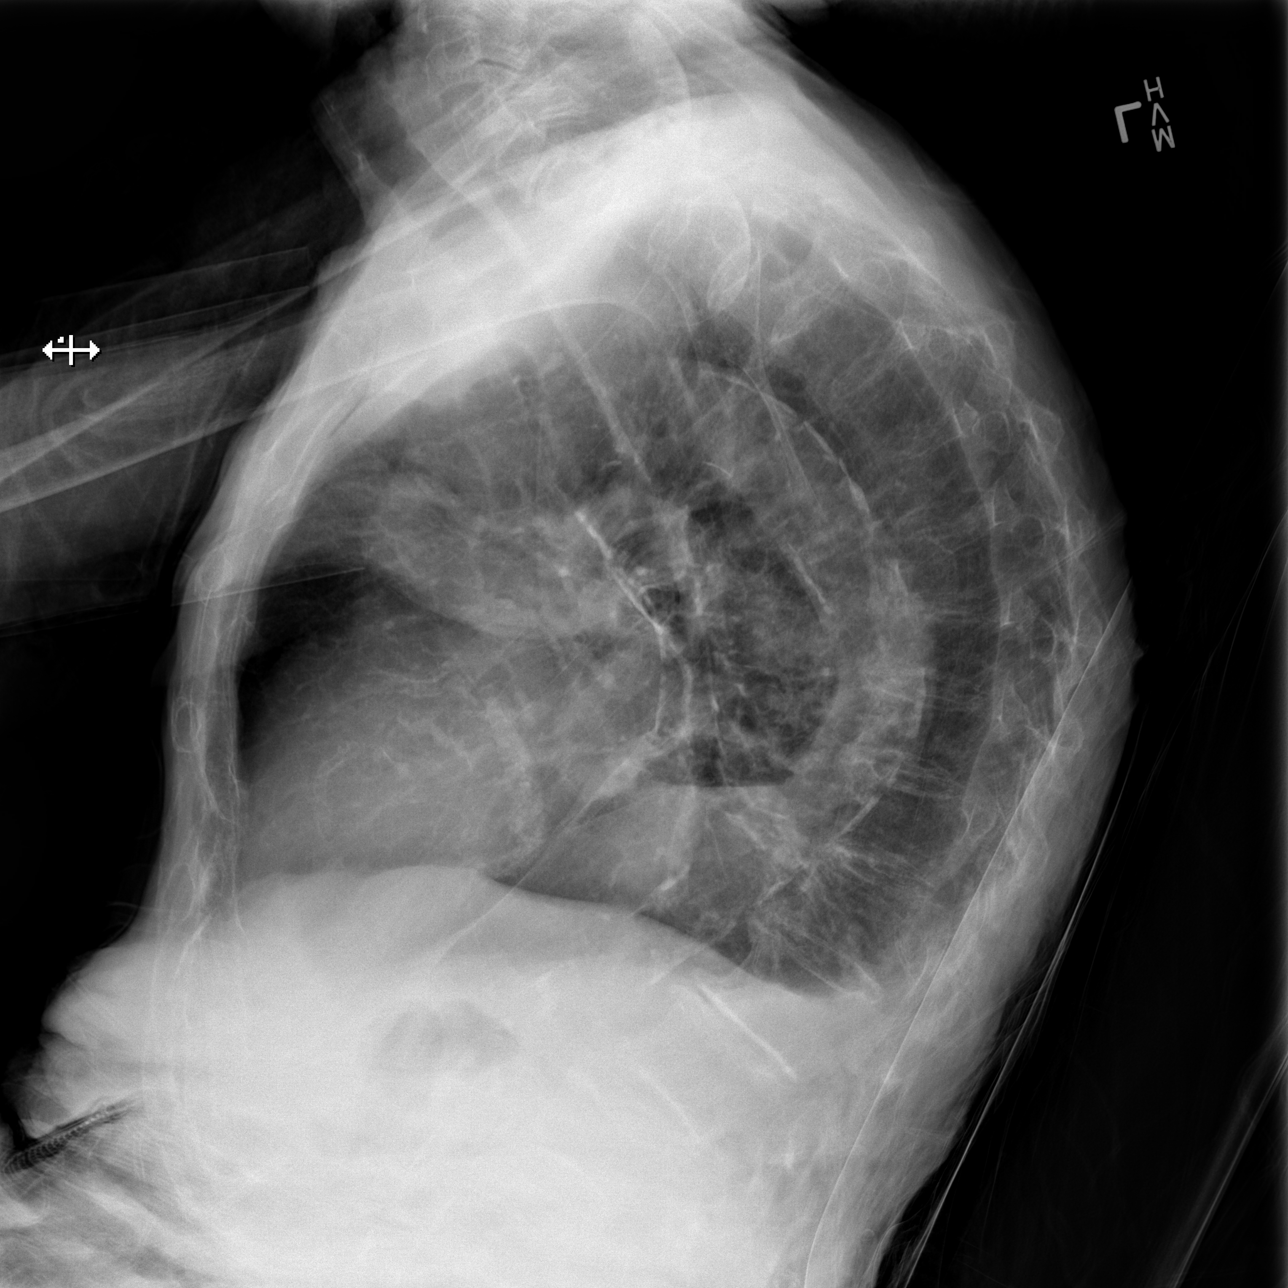

[x chest ap (1 of 2)]
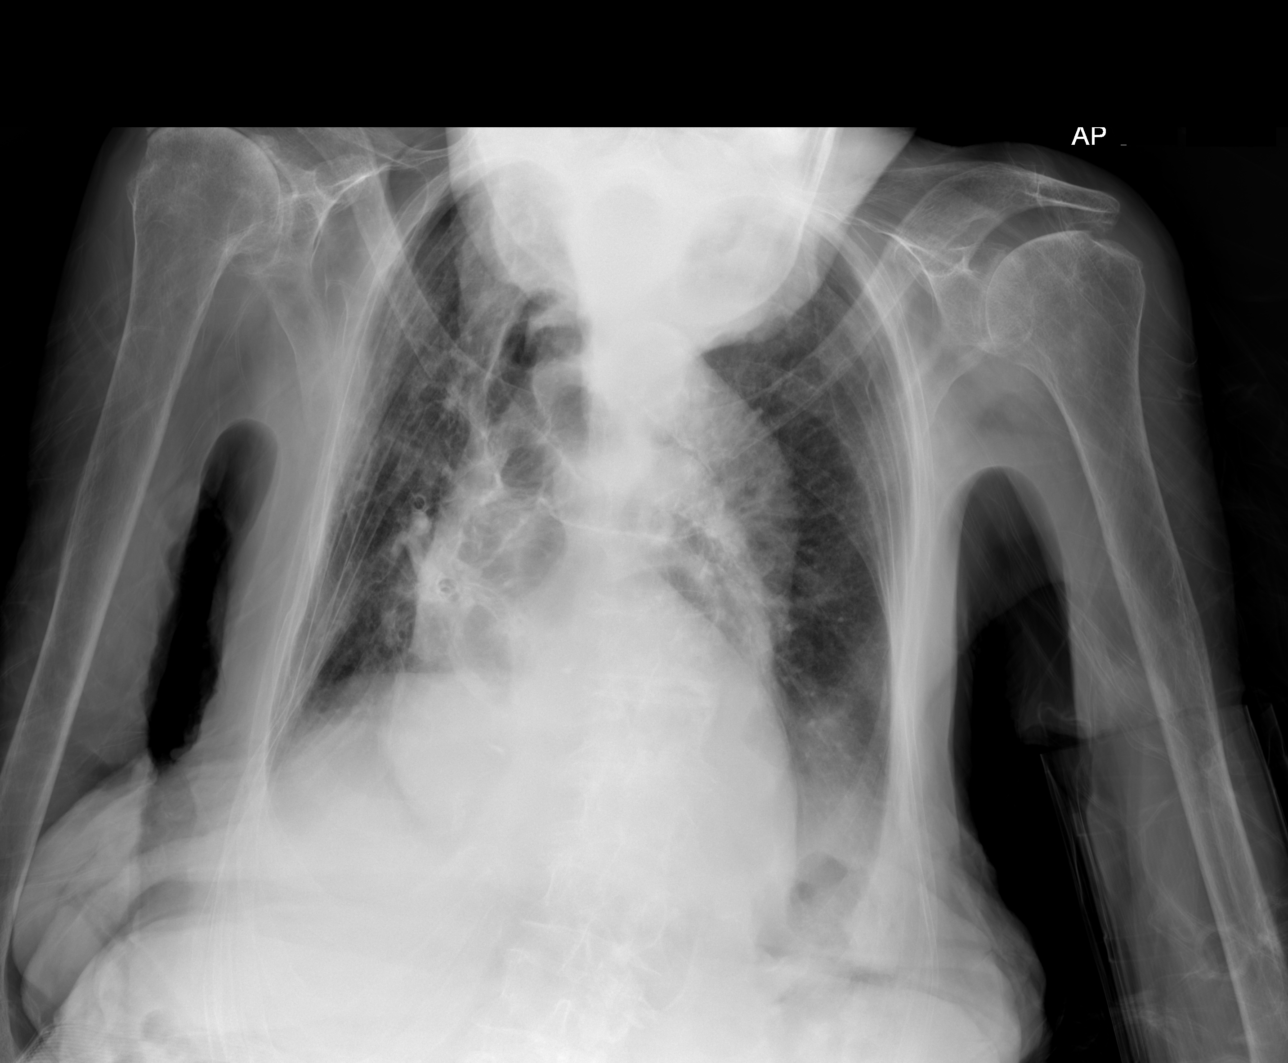

[x chest ap (2 of 2)]
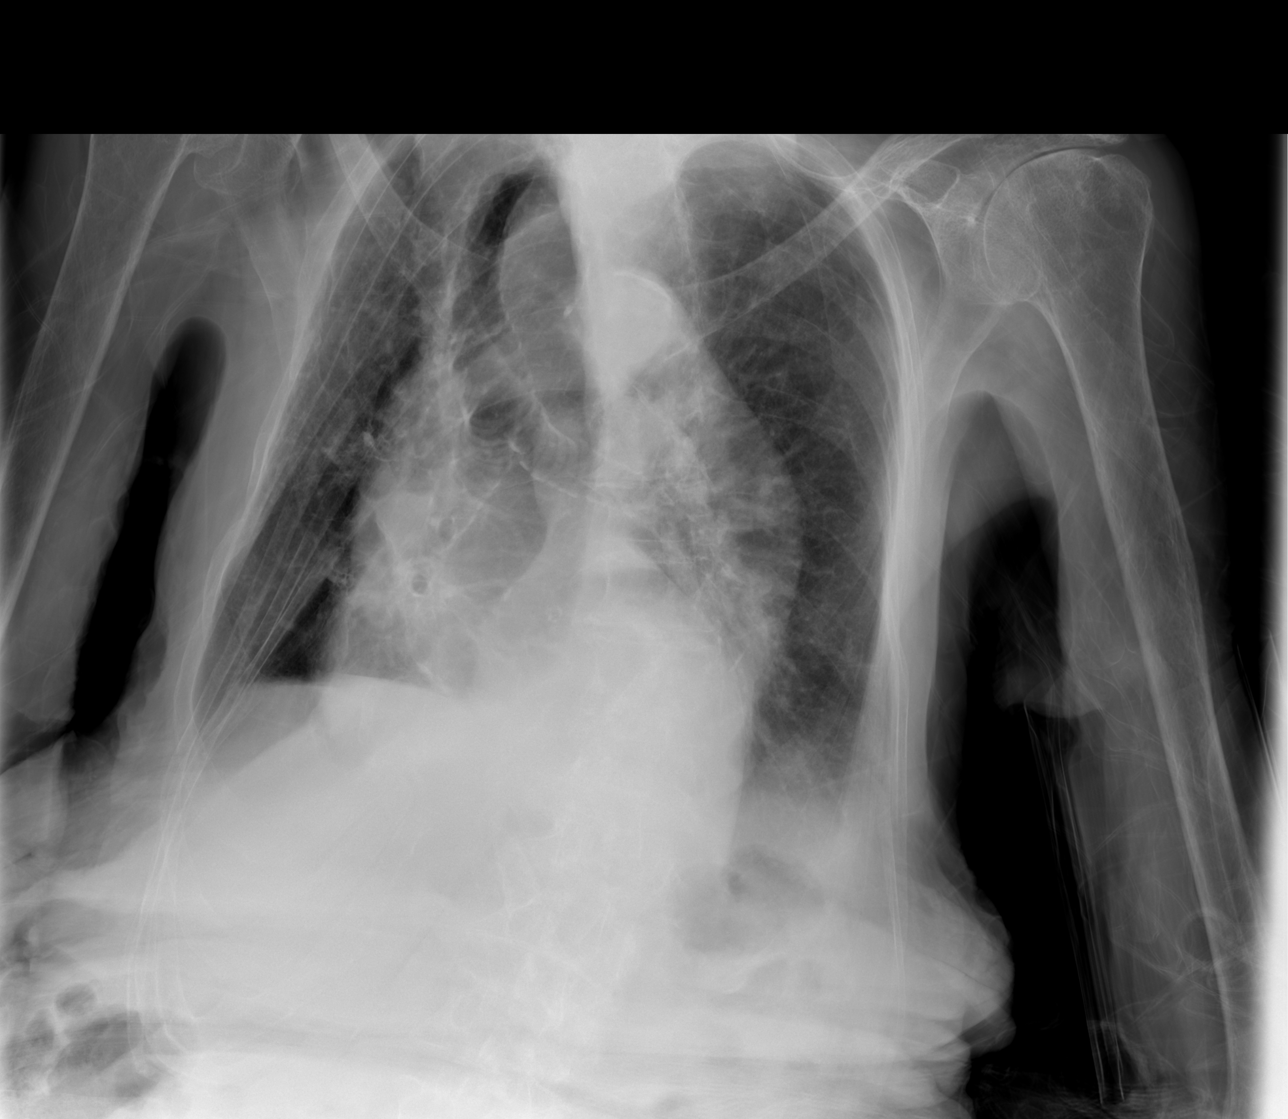

[3 of 3 positions shown; findings below may reference images not displayed]

FINDINGS: There is no appreciable edema or consolidation. Heart is upper
normal in size with pulmonary vascularity within normal limits.
Aorta is tortuous with atherosclerotic calcification in the aorta.
There are multiple foci of coronary artery calcification as well as
foci of mitral annulus calcification. There is no appreciable
adenopathy.

No pneumothorax. Bones are osteoporotic. There is degenerative
change in the right shoulder with superior migration of the right
humeral head.
IMPRESSION: No edema or consolidation. Heart upper normal in size. Tortuous
aorta with aortic atherosclerosis. There are foci of coronary artery
calcification.

Bones osteoporotic. Chronic rotator cuff tear on the right. No
pneumothorax evident.

Aortic Atherosclerosis ([id number]).

## 2018-08-27 DEATH — deceased
